# Patient Record
Sex: Male | Born: 1975 | Race: White | Hispanic: No | Marital: Married | Smoking: Former smoker
Health system: Southern US, Community
[De-identification: ages and names within clinical notes are randomized; demographics above are authoritative.]

## PROBLEM LIST (undated history)

## (undated) HISTORY — PX: ADENOIDECTOMY: SUR15

## (undated) HISTORY — PX: TONSILLECTOMY: SUR1361

---

## 1999-11-17 ENCOUNTER — Emergency Department (HOSPITAL_COMMUNITY): Admission: EM | Admit: 1999-11-17 | Discharge: 1999-11-17 | Payer: Self-pay | Admitting: Emergency Medicine

## 1999-11-17 ENCOUNTER — Encounter: Payer: Self-pay | Admitting: Emergency Medicine

## 1999-11-20 ENCOUNTER — Emergency Department (HOSPITAL_COMMUNITY): Admission: EM | Admit: 1999-11-20 | Discharge: 1999-11-20 | Payer: Self-pay | Admitting: Emergency Medicine

## 2000-11-18 ENCOUNTER — Emergency Department (HOSPITAL_COMMUNITY): Admission: EM | Admit: 2000-11-18 | Discharge: 2000-11-18 | Payer: Self-pay | Admitting: Emergency Medicine

## 2000-11-18 ENCOUNTER — Encounter: Payer: Self-pay | Admitting: Emergency Medicine

## 2001-01-17 ENCOUNTER — Emergency Department (HOSPITAL_COMMUNITY): Admission: EM | Admit: 2001-01-17 | Discharge: 2001-01-17 | Payer: Self-pay | Admitting: Emergency Medicine

## 2001-09-02 ENCOUNTER — Emergency Department (HOSPITAL_COMMUNITY): Admission: EM | Admit: 2001-09-02 | Discharge: 2001-09-02 | Payer: Self-pay | Admitting: Emergency Medicine

## 2002-11-05 ENCOUNTER — Encounter: Payer: Self-pay | Admitting: Emergency Medicine

## 2002-11-05 ENCOUNTER — Inpatient Hospital Stay (HOSPITAL_COMMUNITY): Admission: EM | Admit: 2002-11-05 | Discharge: 2002-11-07 | Payer: Self-pay | Admitting: Emergency Medicine

## 2003-08-14 ENCOUNTER — Emergency Department (HOSPITAL_COMMUNITY): Admission: EM | Admit: 2003-08-14 | Discharge: 2003-08-15 | Payer: Self-pay | Admitting: *Deleted

## 2004-01-06 ENCOUNTER — Observation Stay (HOSPITAL_COMMUNITY): Admission: EM | Admit: 2004-01-06 | Discharge: 2004-01-07 | Payer: Self-pay | Admitting: Emergency Medicine

## 2005-11-29 ENCOUNTER — Inpatient Hospital Stay (HOSPITAL_COMMUNITY): Admission: EM | Admit: 2005-11-29 | Discharge: 2005-11-30 | Payer: Self-pay | Admitting: Emergency Medicine

## 2005-11-30 ENCOUNTER — Ambulatory Visit: Payer: Self-pay | Admitting: Gastroenterology

## 2006-10-14 ENCOUNTER — Inpatient Hospital Stay (HOSPITAL_COMMUNITY): Admission: AD | Admit: 2006-10-14 | Discharge: 2006-10-15 | Payer: Self-pay | Admitting: Internal Medicine

## 2006-10-14 ENCOUNTER — Ambulatory Visit: Payer: Self-pay | Admitting: Gastroenterology

## 2007-09-23 ENCOUNTER — Observation Stay (HOSPITAL_COMMUNITY): Admission: EM | Admit: 2007-09-23 | Discharge: 2007-09-24 | Payer: Self-pay | Admitting: Emergency Medicine

## 2007-09-23 ENCOUNTER — Ambulatory Visit: Payer: Self-pay | Admitting: Internal Medicine

## 2007-11-04 ENCOUNTER — Inpatient Hospital Stay (HOSPITAL_COMMUNITY): Admission: EM | Admit: 2007-11-04 | Discharge: 2007-11-06 | Payer: Self-pay | Admitting: Emergency Medicine

## 2007-11-05 ENCOUNTER — Ambulatory Visit: Payer: Self-pay | Admitting: Gastroenterology

## 2010-09-05 NOTE — Consult Note (Signed)
NAME:  Samuel [name], Samuel [name]                ACCOUNT NO.:  [id number]   MEDICAL RECORD NO.:  [id number]          PATIENT TYPE:  INP   LOCATION:  A220                          FACILITY:  [facility]   PHYSICIAN:  Kassie Mends, M.D.      DATE OF BIRTH:  01/05/1976   DATE OF CONSULTATION:  10/14/2006  DATE OF DISCHARGE:  10/15/2006                                 CONSULTATION   REASON FOR CONSULTATION:  Abdominal pain and vomiting.   HPI:  Samuel [name] is a 35 year old male who has had intermittent  abdominal pain and vomiting for 7 years.  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  He reports being  worked up extensively at multiple institutions to include [facility], [facility], [facility] and [facility].  His workup reportedly includes multiple endoscopies, CT  scans and barium studies.  The records are not available to me. He  states he has been dealing with this problem since the age of 24.  His  workup has revealed no significant intraabdominal pathology.  He is  maintained chronically on Zantac as well as Levsin.  His last visit to a  gastroenterologist was 1-2 years ago.  He last saw Dr. Jena Gauss, [facility].  He reports using Levsin twice daily and Zantac 150 mg  twice daily.  He reports his symptoms are usually exacerbated by stress.  His symptoms are especially made worse when he has a large contract to  negotiate.  He is hospitalized between 2-10 times a year.  He usually  remains in the hospital for 48 hours and his symptoms resolve.  He  usually receives Zantac, Zofran and diazepam as well as Levsin.  His  symptoms, he reports having abdominal pain daily located in his  periumbilical area.  It is crampy.  It is always in the same spot.  His  daily symptoms come and go.  They may last from minutes to hours.  He  initially reported no particular triggers but then feels that his  symptoms may be made  worse with fatty foods or lactose-containing  products.  The fatty foods include, pizzas and hamburgers.  His diet  usually consists of grilled chicken and rice.  He also can tolerate cold  cuts.  He denies any alcohol use.  He still smokes marijuana.  Stress  makes his stomach hurt more.  He Lives his life scared of the stomach.  He denies any fever.  He does have heartburn and indigestion.  His  typical symptoms start with an increased vomiting.  Today he feels  better but his stomach Is not ready.  He has never been treated for  anxiety.  Typically, he has a bowel movement every day.  His bowel  movements are usually in the morning.  He may have a bowel movement that  occurs up to 5 times over an hour and a half period of time.  He denies  any difficulty swallowing.  He denies any blood in his vomit.  He has no  blood in his stool.   PAST MEDICAL HISTORY:  1. Chronic abdominal pain.  2. Gastroesophageal reflux disease.   PAST SURGICAL HISTORY:  Adenoidectomy.   MEDICATIONS:  1. Zantac.  2. Levsin twice a day.   MEDICATIONS IN THE HOSPITAL:  1. Levsin b.i.d.  2. Protonix 40 mg twice daily.  3. Morphine 1 mg every 4 hours as needed.  4. Zofran 4 mg IV every 6 hours as needed.   ALLERGIES:  PHENERGAN.   SOCIAL HISTORY:  1. He is married and has 3 children.  2. 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  3. He does not consume alcohol.  4. He continues to smoke marijuana.   REVIEW OF SYSTEMS:  As per the HPI, otherwise all systems are negative.   PHYSICAL EXAM:  Temperature 98.8, blood pressure 108/57, pulse 83,  respiratory rate 18, O2 sat 98%, weight 151.1 pounds (no change since  August of 2007).  GENERAL:  He is in no apparent distress, alert and oriented x4.  He  makes good eye contact.HEENT EXAM:  Atraumatic, normocephalic.  Pupils  equal and react to light.  Mouth no oral lesions.  Posterior pharynx  without erythema or exudate.NECK:  Has full range  of motion, no  lymphadenopathy.LUNGS:  Clear to auscultation bilaterally.  CARDIOVASCULAR EXAM:  A regular rhythm, no murmur, normal S1 and  S2.ABDOMEN:  Bowel sounds are present, soft.  Mild tenderness to  palpation in the epigastrium without rebound or guarding.  No abdominal  bruits or pulsatile masses.EXTREMITIES:  Without cyanosis, clubbing or  edema.NEURO:  He has no focal neurologic deficits.   LABS:  White count 12.2, hemoglobin 13.4 (unchanged since August of  2007), platelets 207,000, total bili 0.6, AST 50, ALT 28, albumin 3.4,  calcium 8.2, amylase 56, lipase 19.   Abdominal ultrasound from June 23rd:  Gallbladder normal, common bile  duct normal.  Liver, pancreas and spleen normal.  Aorta and IVC normal.  Prominent bowel noted in the subhepatic region.   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  He  admits that his episodes of abdominal pain and vomiting are precipitated  by stress.  He has had an extensive workup at multiple institutions  which revealed no significant intraabdominal pathology.  He currently  has normal liver enzymes, pancreatic enzymes as well as an abdominal  ultrasound.  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   Thank you for allowing me to see Samuel [name] in consultation.  My  recommendations follow.   RECOMMENDATIONS:  1. He is to continue his proton pump inhibitor.  He pays cash for      medicines.  I would discontinue the use of Zantac as an outpatient      and continue him on Prilosec as an outpatient.  2. Would add Valium as needed as an inpatient and continue BuSpar for      anxiety as an outpatient and titrate as needed for his symptoms of      anxiety.  3. Increase his Levsin to four times a day.  4. He may follow up  with Dr. Cira Servant in 6-8 weeks.  5. Continue clear liquids for now and advance to a full liquid diet in      the morning.  If he tolerates, would discharge  on October 15, 2006.      Kassie Mends, M.D.  Electronically Signed     SM/MEDQ  D:  10/14/2006  T:  10/15/2006  Job:  [id number]

## 2010-09-05 NOTE — H&P (Signed)
NAME:  Samuel [name], Samuel [name]                ACCOUNT NO.:  [id number]   MEDICAL RECORD NO.:  [id number]          PATIENT TYPE:  INP   LOCATION:  A325                          FACILITY:  [facility]   PHYSICIAN:  Mila Homer. Sudie Bailey, M.D.DATE OF BIRTH:  [birth year]   DATE OF ADMISSION:  11/04/2007  DATE OF DISCHARGE:  06/03/2009LH                              HISTORY & PHYSICAL   HISTORY:  This 35 year old presented to the [facility] ER today with abdominal  pain, nausea, vomiting, and some diarrhea.  He felt fine yesterday until  the evening when he began to feel nauseated.   Normally, if he uses Zofran for nausea and Levsin for his IBS, he states  he can stave off attacks.  He usually gets nauseated every 2 days and  requires hospitalization every 5-7 months.   These symptoms have gone for 7 years, since he was 31, that would be in  the year 2002.  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   He is born in [place].  Also of note, he has been married for 11 years and  has twin 35 year olds at home from his wife's first marriage, but they  had a baby between the 2 of them which arrived 7 years ago, about the  time his symptoms started.  He lives in the same house.   He has found if he stays to a rice and grilled chicken diet, he does  much better.  He absolutely cannot tolerate any greasy food including  hamburgers.  He can eat potatoes without a problem, he can also eat  bread without a problem.  Again, he had absolutely no symptoms till he  was age 71.   He does use marijuana, notes when he does this, he gets an appetite, and  he has less nausea, but he still cannot tolerate the greasy food, even  using marijuana.   ADMISSION EXAM:  GENERAL:  Showed a pleasant young man.  At the time I  examinedhim, was up on a bed in the floor.   He had alreadyreceived 2  liters of IV fluids.  VITAL SIGNS:  His temperature when he came to the hospital was 98.9,  blood pressure 123/70, pulse 68, respiratory rate 22.  O2 sat was 100%.  As he kept on staying in the ER, blood pressure dropped to 107/63  following 97/53, but the pulse is only 88 with this.  At the time I  examined him, he was oriented and alert.  There is no acute distress.  SKIN:  Skin turgor is normal.  MOUTH:  Mucous membranes appeared to be moist.  HEART:  Regular rhythm rate of 70.  LUNGS:  Clear throughout.  ABDOMEN:  Soft with some tenderness on palpation throughout.  EXTREMITIES:  There is no edema in the ankles.    His admission white cell count is 13,600 with 88% neutrophils, 8%  lymphocytes, H&H was 14.8 and 43.7, MCV  of 94, and platelet count of  204,000.  His CMP was normal except for glucose 135.  LFTs were normal.   His urine drug screen was positive for THC and opiates.   Urine showed an SG of 1.020, pH greater than 9.0, urine ketones greater  than 80.  There were 3-6 wbc's, 3-6 rbc's per HPF, rare bacteria.   ADMISSION DIAGNOSES:  1. Biliary tract disease.  2. Anxiety.  3. Abnormal urine.   PLAN:  We are checking a urine for C&S.  We will start on Levaquin 500  mg IV q.24 h.  Reviewed all his tests including abdominal CT,  ultrasounds, etc., and would repeat an ultrasound and also get  hepatobiliary study (per his history, he has had hepatobiliary studies  at both [facility] in [place]).  We will continue on Zofran  and low-dose Dilaudid.  Were it not for his elevated white cell count, I  would also consider Munchausen syndrome.  An H. pylori antibody is also  pending and stool for O&P and cultures.      Mila Homer. Sudie Bailey, M.D.  Electronically Signed     SDK/MEDQ  D:  11/04/2007  T:  11/05/2007  Job:  [id number]

## 2010-09-05 NOTE — H&P (Signed)
NAME:  Samuel [name], Samuel [name]                ACCOUNT NO.:  [id number]   MEDICAL RECORD NO.:  [id number]          PATIENT TYPE:  OBV   LOCATION:  A319                          FACILITY:  [facility]   PHYSICIAN:  Edward L. Juanetta Gosling, M.D.DATE OF BIRTH:  1975-12-11   DATE OF ADMISSION:  09/22/2007  DATE OF DISCHARGE:  LH                              HISTORY & PHYSICAL   Mr. Santibanez is a 35 year old who has a history of chronic recurrent  abdominal pain associated with nausea and vomiting.  Over the last  several days, he has had multiple episodes of nausea and vomiting.  He  came to the emergency room and was started on IV fluids, given  antiemetics, etc.  He says that he has had multiple bouts of a similar  problem in the past.  He said that he had a temperature as high as 102.  He has had multiple workups in the past, but has not had a definitive  diagnosis made.  He says that when he takes Levsin it helps.  He  sometimes takes Valium and that helps, but sometimes it just gets so bad  that he has to seek attention in the emergency room.  He was treated in  the emergency room, but was not able to be cleared of his symptoms, so  he was brought in for observation.   PAST MEDICAL HISTORY:  Other than these episodes is essentially  negative.  He says that he has had gastroesophageal reflux disease and  then this chronic abdominal pain.  He has being treated as if it is  irritable bowel, but I am not sure if he has actually been diagnosed as  that.   FAMILY HISTORY:  He does have a family history of irritable bowel  syndrome in his mother.  His father had a MI.  There is a history of  gastric cancer.   SOCIAL HISTORY:  He is married and lives at home.  He smokes about a  pack of cigarettes daily.  He does not use any alcohol.  There is a  history in the past medical history that he smokes marijuana, I did not  ask him that.   REVIEW OF SYSTEMS:  Otherwise essentially negative.   PHYSICAL EXAMINATION:   VITAL SIGNS:  Shows his temperature is 98.1,  respirations 20, blood pressure 97/63, O2 sats 100% on room air.  His  weight is 74.1 kg, height 73 inches.  HEENT:  His pupils are equal, round and reactive to light and  accommodation.  His nose and throat are clear.  His mucous membranes are  slightly dry.  NECK:  Supple without masses.  He does not have any bruits.  CHEST:  Fairly clear.  HEART:  Regular.  ABDOMEN:  Soft.  EXTREMITIES:  Showed no edema.  ABDOMEN:  He does show some voluntary guarding of his abdomen and he had  some mild tenderness.  Bowel sounds are hyperactive.  No rebound  tenderness.  CNS:  Grossly intact.   LABORATORY WORK:  Last night, white count was 13,900, hemoglobin 14.6,  platelets 186.  BMET showed potassium of 3.3, otherwise normal.  Hepatic  function panel was normal.  Lipase was normal.  Urinalysis essentially  normal.  He did show some ketones, suggesting that he was dehydrated.  He had acute abdominal series that showed a nonspecific nonobstructive  bowel gas pattern.   ASSESSMENT:  He has recurrent abdominal discomfort, nausea and vomiting,  which I think is of unknown cause.  He is on treatment now, and I will  plan to have him evaluated by the GI team as well.      Edward L. Juanetta Gosling, M.D.  Electronically Signed     ELH/MEDQ  D:  09/23/2007  T:  09/23/2007  Job:  [id number]

## 2010-09-05 NOTE — H&P (Signed)
NAME:  Samuel [name], Samuel [name]                ACCOUNT NO.:  [id number]   MEDICAL RECORD NO.:  [id number]          PATIENT TYPE:  INP   LOCATION:  A220                          FACILITY:  [facility]   PHYSICIAN:  Tesfaye D. Felecia Shelling, MD   DATE OF BIRTH:  June 23, 1975   DATE OF ADMISSION:  10/14/2006  DATE OF DISCHARGE:  06/24/2008LH                              HISTORY & PHYSICAL   CHIEF COMPLAINT:  Abdominal pain, nausea and vomiting.   HISTORY OF PRESENT ILLNESS:  This is a 35 year old male patient with a  history of chronic recurrent abdominal pain who was admitted due to  above complaint.  Patient claims he had a relapse of his symptoms since  the last 3 days.  He came to [facility] emergency department where he was  evaluated.  Patient was later transferred here to [facility] because  there is no gastroenterologist to evaluate the patient in [facility].  Patient received the pain medicine and IV antiemetic  medications.  He is feeling better.  His symptoms have improved.   REVIEW OF SYSTEMS:  The patient has no fever, chills, cough, shortness  of breath, chest pain, dysuria, urgency or frequency of urination.   PAST MEDICAL HISTORY:  Recurrent abdominal pain and nausea and vomiting.   MEDICATIONS:  Levsin 0.125 mg p.o. q.i.d.   SOCIAL HISTORY:  1. Patient is married.  2. He is self-employed.  3. He has no history of alcohol.   PHYSICAL EXAMINATION:  Patient is alert, awake, and sick looking.  VITALS:  Blood pressure 111/68, pulse 67, respiratory rate 20,  temperature 98 degrees Fahrenheit.  HEENT:  Pupils are equal, reactive.  NECK:  Supple.  CHEST:  Clear lung fields, good air entry.  CARDIOVASCULAR SYSTEM:  First and second heart sounds heard, no murmur,  no gallop.  ABDOMEN:  Soft and lax.  Bowel sound is positive.  No masses, no  organomegaly.  EXTREMITIES:  No leg edema.   LABS:  CBC:  A WBC of 2.2, hemoglobin 13.4, hematocrit 38.4, platelets  205,000, lipase 19, amylase 56.   Comprehensive panel:  Sodium 138,  potassium 3.6, chloride 109, carbon dioxide 23, glucose 100, BUN 7,  creatinine 0.79, alkaline phosphatase 56, AST 28, ALT 17, albumin 3.4,  calcium 8.2.   ASSESSMENT:  This is a 35 year old male patient with a history of  recurrent nausea and vomiting.  Patient had extensive workup in the  past.  He has been followed by gastroenterologist.  The patient is being  treated as a case of possible irritable bowel syndrome.   PLAN:  1. Will do GI consult.  2. Will continue current pain medicine and proton pump inhibitor.  3. Will continue patient on IV fluid.      Tesfaye D. Felecia Shelling, MD  Electronically Signed     TDF/MEDQ  D:  10/15/2006  T:  10/15/2006  Job:  [id number]

## 2010-09-05 NOTE — Consult Note (Signed)
NAME:  Samuel [name], Samuel [name]                ACCOUNT NO.:  [id number]   MEDICAL RECORD NO.:  [id number]          PATIENT TYPE:  OBV   LOCATION:  A319                          FACILITY:  [facility]   PHYSICIAN:  R. Roetta Sessions, M.D. DATE OF BIRTH:  [birth year]   DATE OF CONSULTATION:  09/23/2007  DATE OF DISCHARGE:                                 CONSULTATION   REASON FOR CONSULTATION:  Recurrent nausea, vomiting, abdominal pain.   REQUESTING PHYSICIAN:  Edward L. Juanetta Gosling, M.D.   HISTORY OF PRESENT ILLNESS:  A 35 year old Caucasian gentleman with  history of recurrent abdominal pain associated with nausea and vomiting,  requiring multiple hospitalizations for the same who presents for acute  onset of symptoms again.  We last saw him during hospitalization in June  2008.  He has had these symptoms now for about eight years.  He states  he ends up being hospitalized at least five times a year.  He has not  had any significant problems for the last seven months, although he  notes that he has to be on a very strict diet or he will have abdominal  pain, diarrhea and vomiting.  He eats mostly rice and grilled chicken.  He does not eat any dairy products.  He notes if he eats any fatty  foods, he has exacerbation of his symptoms as well as with any stress.  He describes his abdominal pain as cramping.  It is in the periumbilical  region.  He does have a burning quality at times.  He woke up around 4  a.m. two days ago with these symptoms.  States he had a lot of diarrhea  this time as well.  He also reports a temperature of 102.  He states  usually he does have some sort of temperature.  He also breaks out in a  sweat.  He states he drenched all the linens in his house this time.  He  denies any significant weight loss.  He does have some intermittent  heartburn and indigestion for which she takes Zantac p.r.n. but is on  omeprazole daily as well.  He denies any dysphasia or odynophagia.  He  takes  Levsin daily which seems to help with his cramps and loose stools.  Generally has only one bowel movement in the morning but if he eats  anything that does not agree with him, he may have to sit on the toilet  for three or four hours at a time.  He denies any blood in the stool or  melena.  Denies any dysuria or hematuria.  He admits to smoking  marijuana but states he has really cut back and only smokes two joints a  week as opposed to four daily.  He denies any alcohol use or tobacco  use.   He reports an extensive evaluation in the past.  He says he has been  worked up at [facility] and [facility], [facility] and here at [facility].  He reports having multiple  endoscopies, CTs and barium studies.  I looked in our old  chart and  unfortunately, we do not have any of the official studies in our chart  as far as endoscopies or colonoscopies.  Here at [facility],  the last abdominal ultrasound was in June 2008 and revealed prominent  small-bowel loops which are nonspecific.  Common bile duct diameter was  4, otherwise the liver and the gallbladder were normal.  The last CT at  [facility] was in April 2005, it was negative.  He had acute  abdominal series on admission.  This time it was nonobstructive bowel  gas pattern.  Urinalysis revealed 40 ketones, lipase 16. LFTs normal.  His glucose was slightly elevated at 128, potassium 3.3, sodium 135,  creatinine 0.89.  White count 13,900, hemoglobin 14.6, platelets  186,000.   HOME MEDICATIONS:  He says he is taking  1. Levsin sublingual three times a day.  2. Zantac 150 mg at night.  3. Omeprazole 20 mg daily.   ALLERGIES:  He states that PHENERGAN burns his veins up and refuses to  take any form of it.   PAST MEDICAL HISTORY:  1. Chronic GERD.  2. Chronic cyclic vomiting and abdominal pain as above, not well-      defined.  3. Chronic intermittent diarrhea.   PAST SURGICAL HISTORY:   Adenoidectomy.   FAMILY HISTORY:  Mother had IBS and diverticulitis and had some sort of  surgery with possible small-bowel resection.  Father had an MI and  cerebral aneurysm and apparently had some sort of colon surgery with  colonoscopy but again, details unavailable but was having reversible  colostomy back in 2007, when I last saw the patient.  A great uncle had  brain cancer.   SOCIAL HISTORY:  He is married.  He has three children.  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  Denies alcohol use.  Continues  to smoke marijuana, no tobacco use.   REVIEW OF SYSTEMS:  See HPI for GI.  CARDIOPULMONARY:  No chest pain or  shortness of breath.  CONSTITUTIONAL:  No weight loss.  GENITOURINARY:  See HPI.   PHYSICAL EXAMINATION:  VITAL SIGNS:  Temperature 97.5, pulse 70,  respirations 20, blood pressure 108/59, weight 74.1 kg, height 73  inches.  GENERAL APPEARANCE:  A pleasant, well-developed, well-nourished  Caucasian male in no acute distress.  SKIN:  Warm and dry, no jaundice.  HEENT:  Sclerae are nonicteric.  Oropharyngeal mucosa moist and pink.  No lesions, erythema or exudate.  No lymphadenopathy or thyromegaly.  CHEST:  Lungs are clear to auscultation.  CARDIOVASCULAR:  Regular rate and rhythm, normal S1 and S2, no murmurs,  rubs, or gallops.  ABDOMEN:  Positive bowel sounds.  Abdomen soft.  He has mild tenderness  in the epigastrium to deep palpation, no rebound or guarding, no  organomegaly or masses.  No abdominal hernias.  LOWER EXTREMITIES:  No edema.   IMPRESSION:  Patient is a 35 year old gentleman with history of  recurrent periumbilical crampy abdominal pain associated with nausea and  vomiting and sometimes diarrhea, requiring multiple hospitalizations  over the past eight years.  He presents with recurrent symptoms.  He  describes having a temperature of 102.  This time, he seems to have more  diarrhea than usual.  Symptoms  began two days ago.  He  continues to  have vomiting at this point.  He reports an extensive work-up in the  past but unfortunately, our records at the office really do not have all  the details.  A lot of his work-up has been done at [facility] and at [facility].  We had seen the patient  multiple times in the office and requested labs to be done but he failed  to follow through.  At one point in time, he did have a significantly  elevated cortisol level and plans for a dexamethasone suppression test  were requested but  he never had this done.  We also entertained the  possibility of acute intermittent porphyruria.  This, again, has not  been checked.  Etiology of his symptoms are not clear.  The differential  would include possibility of cyclic vomiting syndrome, functional  abdominal pain/irritable bowel syndrome; however, given his temperature  and his leukocytosis, I would be concerned about other etiology  including even gastroenteritis.   RECOMMENDATIONS:  1. Continue supportive measures.  2. We will provide Zofran on schedule for the next 24 hours.  3. Add Levsin morning and bedtime.  4. Recommend no further marijuana use.  5. To discuss further with Dr. Jena Gauss.  Further recommendations to      follow.      Tana Coast, P.AJonathon Bellows, M.D.  Electronically Signed    LL/MEDQ  D:  09/23/2007  T:  09/23/2007  Job:  [id number]   cc:   Tesfaye D. Felecia Shelling, MD  Fax: [phone]   Oneal Deputy. Juanetta Gosling, M.D.  Fax: [phone]

## 2010-09-05 NOTE — Group Therapy Note (Signed)
NAME:  Samuel, [name]                ACCOUNT NO.:  [id number]   MEDICAL RECORD NO.:  [id number]          PATIENT TYPE:  OBV   LOCATION:  A319                          FACILITY:  [facility]   PHYSICIAN:  Edward L. Juanetta Gosling, M.D.DATE OF BIRTH:  1976-02-18   DATE OF PROCEDURE:  DATE OF DISCHARGE:  09/24/2007                                 PROGRESS NOTE   SUBJECTIVE:  Samuel [name] says, he is still having a terrible time having  lots of nausea.  He has been diagnosed as, the hyperemesis syndrome by  the GI consultant, and their help is greatly appreciated.  This morning  he says, he is still not doing well and has abdominal discomfort,  nausea, and vomiting.   OBJECTIVE:  Physical exam shows that he is awake.  His temperature is  97.8, pulse 74, respirations 20, and blood pressure 109/51.  His chest  is fairly clear.  He begins some voluntary guarding before I even touch  his abdomen, but he does not have any rebound tenderness.  Bowel sounds  are present and active.   ASSESSMENT:  He seems a little better.   PLAN:  Double up on his PPI because he still says he has burning in his  abdomen.  Continue with all of his other treatments.  Dr. Felecia Shelling will  assume his care in the morning.      Edward L. Juanetta Gosling, M.D.  Electronically Signed     ELH/MEDQ  D:  09/24/2007  T:  09/24/2007  Job:  [id number]

## 2010-09-05 NOTE — Consult Note (Signed)
NAME:  Samuel [name], Samuel [name]                ACCOUNT NO.:  [id number]   MEDICAL RECORD NO.:  [id number]          PATIENT TYPE:  INP   LOCATION:  A325                          FACILITY:  [facility]   PHYSICIAN:  Kassie Mends, M.D.      DATE OF BIRTH:  08-Nov-1975   DATE OF CONSULTATION:  DATE OF DISCHARGE:                                 CONSULTATION   REQUESTING PHYSICIAN:  Tesfaye D. Felecia Shelling, MD   REASON FOR CONSULTATION:  Nausea.   HISTORY OF PRESENT ILLNESS:  The patient is a 35 year old Caucasian  gentleman whom we have seen previously on multiple hospitalizations for  similar symptoms of recurrent abdominal pain associated with nausea and  vomiting.  We last saw him in June of 2009.  He had signed out AMA  reportedly because his child was in a bicycle accident.  He states that  his current episode of severe abdominal pain, nausea and vomiting began  about 2 days ago.  He initially had multiple loose stools while on the  job in the early morning.  By the following day, his abdominal pain had  intensified and he started having nausea and vomiting.  Yesterday  morning, during the early hours, he was vomiting and having so much  diarrhea he could not hardly stand it.  He eventually came to the  emergency department.  He states that today he had already taken 14 hot  showers which alleviates his nausea.  He notes that when he does get  sick he takes multiple hot showers with good response.  He continues to  smoke marijuana regularly.  He states that he has cut back and using  1/4th bag weekly as opposed to a bag weekly.  During his last  hospitalization it was felt that his abdominal pain and vomiting was  secondary to ongoing marijuana use.  He has been smoking marijuana, now,  for 16 years.  It was felt that he had a nausea/vomiting syndrome  related to this.   He states that the only things that he can eat is rice and grilled  chicken.  He cannot eat any dairy products or fat as it exacerbates  the  symptoms.  He also has daily diarrhea and usually has bowel movements 1-  2 hours after morning.  He denies any blood in the stool or melena.  He  has not had as bad sweats this time as he usually does.  He has Levsin  twice daily, regularly.  He has been taking Zantac about five 150 mg  tablets daily.  He has Prilosec OTC and takes this as well.  He denies  any dysphagia or odynophagia.  He has lost 4 kg since his last admission  1 month ago.   On presentation his white count was slightly elevated at 13,600 it is  13,400 today.  His hemoglobin is 13.7.  LFTs are normal.  Sedimentation  rate normal at 5.  Urine drug screen positive for opiates, and  marijuana.  He has been afebrile.  He was not able to have his  gallbladder ultrasound this morning  as he cannot withhold using  narcotics for abdominal pain.   MEDICATIONS AT HOME:  1. Levsin sublingual twice a day as needed.  2. Zantac 150 mg tablets, he is taking up to 5 a day.  3. Omeprazole 20 mg daily p.r.n.   ALLERGIES:  Phenergan causes his veins to burn up.  He refuses to take  any form of it.   PAST MEDICAL HISTORY:  1. Chronic GERD.  2. Chronic cyclic vomiting and abdominal pain as above.  3. Chronic intermittent diarrhea.   PAST SURGICAL HISTORY:  Adenoidectomy.   FAMILY HISTORY:  Mother has IBS, diverticulitis and some sort of surgery  for a small-bowel resection.  Father had an MI, cerebral aneurysm and  had some sort of colon surgery, patient's wife believes that it was due  to colon cancer back in 2007.  A great uncle had brain cancer.   SOCIAL HISTORY:  He is married.  He has 3 children.  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  He denies alcohol use.  He continues to smoke marijuana regularly.  No  tobacco use.   REVIEW OF SYSTEMS:  See HPI for GI and constitutional.  CARDIOPULMONARY:  He has been coughing up some phlegm, and he does this with these   episodes.  Denies any shortness of breath or chest pain.  GENITOURINARY:  Denies any dysuria or hematuria.   PHYSICAL EXAMINATION:  VITAL SIGNS:  Weight 70.9 kg. Temperature 97.1,  pulse 64, respirations 20, blood pressure 106/54.  GENERAL:  A pleasant, thin, Caucasian male who appears to be  uncomfortable.  SKIN:  Warm and dry.  No jaundice.  HEENT:  Sclerae anicteric.  Oropharyngeal mucosa moist and pink.  CHEST:  Lungs are clear to auscultation.  CARDIAC EXAM:  Reveals regular rate and rhythm.  Normal S1, S2.  No  murmurs, rubs, or gallops.  ABDOMEN:  Positive bowel sounds. Abdomen soft.  He has moderate  tenderness in the epigastrium to deep palpation. No rebound or guarding.  No organomegaly or masses.  No abdominal  bruits or hernias.  EXTREMITIES:  No edema.   LABS:  As mentioned above.  In addition his potassium is 3.3, BUN 10,  creatinine 0.87, glucose 94, sodium 141.  Platelets 187,000.  Lipase 17,  total bilirubin 1, alkaline phosphatase 48, AST 23, ALT 15, albumin 4.2.   IMPRESSION:  The patient is a 35 year old gentleman with recurrent  abdominal pain, nausea and vomiting in the setting of chronic, regular  marijuana use.  It is notable that he takes multiple hot showers which  alleviates his nausea and vomiting.  He has taken 14 already today.  In  the literature there are multiple documented cases of intractable nausea  and vomiting related to marijuana use.  I suspect that his symptoms are  due to ongoing chronic marijuana use with use for now over 16 years.  He  also has regular postprandial loose stools possibly due to irritable  bowel syndrome.  The patient has had EGD and colonoscopies in the past,  unfortunately we do not have these records.   PLAN:  1. He needs to avoid all marijuana use.  2. Will followup when abdominal ultrasound is available.  3. Will provide Zofran 4 mg IV q.6 h. Scheduled dosing and then 4 mg      IV q.4 h. p.r.n.  4. Continue Levsin.   5. Will change Protonix to IV q.24 h.  6. Celiac disease antibody evaluation.  7. A 24-hour urinary collection for ALA and PBG to rule out acute      intermittent porphyria.  8. He may need to have repeat testing including an EGD but we discuss      further with Dr. Cira Servant pending current workup.      Tana Coast, P.A.      Kassie Mends, M.D.  Electronically Signed    LL/MEDQ  D:  11/05/2007  T:  11/05/2007  Job:  [id number]   cc:   Kassie Mends, M.D.  [address]  [place] , [place] [address]   Tesfaye D. Felecia Shelling, MD  Fax: [phone]

## 2010-09-08 NOTE — Discharge Summary (Signed)
NAME:  Samuel [name], Samuel [name]                          ACCOUNT NO.:  [id number]   MEDICAL RECORD NO.:  [id number]                   PATIENT TYPE:  INP   LOCATION:  A332                                 FACILITY:  [facility]   PHYSICIAN:  Sarita Bottom, M.D.                  DATE OF BIRTH:  December 02, 1975   DATE OF ADMISSION:  11/05/2002  DATE OF DISCHARGE:  11/07/2002                                 DISCHARGE SUMMARY   DISCHARGE DIAGNOSES:  1. Abdominal pain possibly due to gastroenteritis.  2. Rectal bleed probably secondary to number one above.   DISCHARGE MEDICATIONS:  Levsin 0.125 mg q.4h. p.r.n.   RECOMMENDATIONS:  Patient to followup with primary M.D. and also to see Dr.  Jena Gauss as an outpatient for further workup for abdominal pain and GI bleed.   Samuel [name] is a 35 year old man with no significant medical history who was  admitted to [facility] on November 05, 2002 when he presented to the  emergency room with a complaint of diarrhea and vomiting after eating a  sandwich.  The patient's stool was noted to be guaiac positive in the  emergency room.   PHYSICAL EXAMINATION:  VITAL SIGNS:  Pertinent findings temperature of 97.8,  BP of 138/90 with a heart rate of 66.  ABDOMEN:  Showed diffuse tenderness.   LABORATORY DATA:  His admission laboratory tests showed a WBC of 10,000,  hemoglobin of 15.  He had a normal liver function test.   The patient was admitted with an impression of abdominal pain possible  secondary to gastroenteritis and too a questionable GI bleed.  He was made  NPO, treated with IV fluids.  He had serial hemoglobin and hematocrit done.  Patient was also seen by GI consult whose impression was also of  gastroenteritis.  The patient's hematocrit and hemoglobin did not have any  significant drop.  His clinical course was significant for improvement in  his abdominal pain with supportive care.  Eventually the pain subsided and  the patient was resumed on feeding which he  tolerated  very well.  He has been seen on round today.  He has no new complaint.  Vital signs are stable.  Physical exam is essentially unremarkable.  __________ hemoglobin is 14.5, hematocrit is 42.3.  The patient will be  discharged home today on the above medications.  Appointment will be made  for him to see Dr. Jena Gauss and also his primary M.D.                                                 Sarita Bottom, M.D.    DW/MEDQ  D:  11/07/2002  T:  11/08/2002  Job:  [id number]   cc:   R. Roetta Sessions, M.D.  [address]  [facility] [address]  Fax: [phone]

## 2010-09-08 NOTE — Discharge Summary (Signed)
NAME:  Samuel [name], SKOWRON                          ACCOUNT NO.:  [id number]   MEDICAL RECORD NO.:  [id number]                   PATIENT TYPE:  INP   LOCATION:  A321                                 FACILITY:  [facility]   PHYSICIAN:  Vania Rea, M.D.              DATE OF BIRTH:  1975-05-25   DATE OF ADMISSION:  01/06/2004  DATE OF DISCHARGE:  01/07/2004                                 DISCHARGE SUMMARY   PRIMARY CARE PHYSICIAN:  Unassigned.   DISCHARGE DIAGNOSES:  1.  Acute gastroenteritis of unclear etiology, resolved.  2.  History of recurrent gastroenteritis.   DISPOSITION:  Discharged to home.   DISCHARGE CONDITION:  Stable.   DISCHARGE MEDICATIONS:  Resume outpatient Zantac and Levsin.   HOSPITAL COURSE:  Please refer to the admission history and physical  dictated yesterday.  This is a 35 year old Caucasian man with a history of  recurrent episodes of nausea, vomiting and diarrhea, which has been  extensively investigated by the GI service with no clear etiology found.  The patient describes his symptoms as coming rapidly under control when he  is admitted to the hospital, given IV fluids, antiemetics, and pain  medications.  The patient was admitted yesterday and given intravenous  Phenergan, IV fluids, and low doses of Demerol.  The patient had a total of  50 mg of Demerol throughout his hospital stay.  This morning the patient  says he is feeling much better.  He is having no nausea and vomiting.  He is  able to tolerate breakfast without any difficulty and wishes to be  discharged.   PHYSICAL EXAMINATION:  GENERAL:  On examination he is alert, smiling, happy,  and very pleasant.  He expressed thanks for the care given during his  hospital stay.  VITAL SIGNS:  His temperature was 98.6, pulse 75, respirations 20, and blood  pressure 101/63.  CHEST:  Clear to auscultation.  HEART:  Regular without murmurs.  ABDOMEN:  Soft and nontender.  EXTREMITIES:  No lower extremity  edema.   LABORATORY DATA:  His white count is 13.1, hemoglobin 14.4, MCV 91 with 76%  neutrophils.  His sodium is 136, potassium 3.5, chloride 107, CO2 is 23,  glucose 127, BUN 9, creatinine 0.9.  His urine drug screen was positive only  for tetrahydrocannabinol.   ASSESSMENT:  Recurrent gastroenteritis of unclear etiology, resolved.   PLAN:  Discharge home.   FOLLOW UP:  With a primary care physician of his choice.  He usually sees  Dr. Neita Carp but as indicated, he is no longer seeing this doctor.  Follow up  with Dr. Karilyn Cota and Dr. Jena Gauss p.r.n.     ___________________________________________                                         Vania Rea, M.D.  LC/MEDQ  D:  01/07/2004  T:  01/07/2004  Job:  [id number]

## 2010-09-08 NOTE — Consult Note (Signed)
NAME:  Samuel [name], Samuel [name]                          ACCOUNT NO.:  [id number]   MEDICAL RECORD NO.:  [id number]                   PATIENT TYPE:  INP   LOCATION:  A332                                 FACILITY:  [facility]   PHYSICIAN:  R. Roetta Sessions, M.D.              DATE OF BIRTH:  Aug 30, 1975   DATE OF CONSULTATION:  DATE OF DISCHARGE:                                   CONSULTATION   GASTROINTESTINAL CONSULTATION:   REASON FOR CONSULTATION:  Diarrhea with heme positive stools.   HISTORY OF PRESENT ILLNESS:  Samuel [name] is a 35 year old white male who  presented yesterday to the emergency room with acute onset of nausea,  vomiting and diarrhea at approximately noon yesterday, approximately one  hour after ingesting a bologna sandwich which he picked up at a convenience  store.  He complained of some burning pain to his epigastrium.  He then  took a packet of BC Powder.  At that point in time, he had approximately  seven bouts of diarrhea with bright red blood per his rectum.  He reported  to the emergency room at that point in time.  He also became nauseated and  had some emesis which was green.  He denies any NSAID use.  He reports BC  Powder use once a week.  He denies any history of hemorrhoids.  Stool was  found to be heme positive in the ER.  Acute abdominal films were obtained  which were negative.  He continues to complain of some crampy, low,  bilateral abdominal pain which is relieved some with Demerol.  He has had no  diarrhea today.  He has had no emesis today.   PAST MEDICAL HISTORY:  Positive for adenoids removed.   MEDICATIONS PRIOR TO ADMISSION:  Occasional weekly BC Powder.   ALLERGIES:  No known drug allergies.   FAMILY HISTORY:  Mother is alive at 42 years old with IBS and  diverticulitis.  Father is alive at 97 with a cerebral aneurism.  He has one  sister and one step-brother who are both healthy.   SOCIAL HISTORY:  He is married and has one healthy child.  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  He denies any tobacco or  alcohol use.   REVIEW OF SYSTEMS:  CONSTITUTIONAL:  He reports stable weight.  He reports  appetite is okay prior to this episode.  He reports slight fever yesterday.  MUSCULOSKELETAL:  He denies any musculoskeletal pains.  CARDIOPULMONARY:  He  denies any chest pain.  He denies any shortness of breath or palpitations.  SKIN:  He denies any rashes or jaundice.   PHYSICAL EXAMINATION:  VITAL SIGNS:  Temp 97.5, pulse 100, respirations 20,  blood pressure 101/75.  GENERAL:  He is a thin, well-developed, well-nourished, white male in no  acute distress.  He is alert and oriented  x 3.  SKIN:  Pink, warm and dry without any lesions.  There is no diaphoresis.  There is no jaundice.  HEENT:  Sclera are clear without any icterus.  Conjunctivae pink.  Oropharynx pink and moist without any lesions.  CHEST:  Heart regular, rate and rhythm, 90s-100 without murmurs, clicks,  rubs or gallops.  LUNGS:  Clear to auscultation bilaterally.  ABDOMEN:  Flat.  Positive bowel sounds x 4.  Positive mild epigastric  tenderness.  Nondistended.  No organomegaly.   LABORATORY DATA:  Hemoglobin 14.6, hematocrit 42.3 and stable.  Sodium 137,  potassium 3.8, chloride 107, CO2 24, glucose 102, BUN 18, creatinine 0.6.  Calcium 9.8.  Total protein 7.6, albumin 4.4, total bilirubin 1.0, direct  bilirubin 0.1, indirect 0.9, alkaline phosphatase 54, SGOT 30, SGPT 43.  Amylase 70, lipase 27.   ASSESSMENT:  Samuel [name] is a 35 year old white male with acute onset nausea,  vomiting and heme positive diarrhea.  Hemoglobin and hematocrit are stable.  This is possibly secondary to food borne illness versus gastroenteritis  (less likely) or diverticular disease (least likely).  He is otherwise  healthy with no known GI signs or symptoms.   RECOMMENDATIONS:  1. We will obtain stool cultures for O&P, WBC and C. difficile.  2. We will continue  supportive measures to include IV fluids, clear liquid     diet and Protonix.  3. We will add Levsin 0.125 mg sublingual before meals and at bedtime.  4. We will consider colonoscopy +/- EGD if bleeding persists or if there is     a drop in hemoglobin and hematocrit.  5. Further recommendations to follow.   I would like to thank Dr. Suzanne Boron for this consult.      Samuel [name], N.P.                 Jonathon Bellows, M.D.    KC/MEDQ  D:  11/06/2002  T:  11/07/2002  Job:  [id number]   cc:   Marden Noble, M.D.

## 2010-09-08 NOTE — H&P (Signed)
NAME:  Samuel [name], Samuel [name]                          ACCOUNT NO.:  [id number]   MEDICAL RECORD NO.:  [id number]                   PATIENT TYPE:  EMS   LOCATION:  ED                                   FACILITY:  [facility]   PHYSICIAN:  Marden Noble, M.D.                DATE OF BIRTH:  1975/11/05   DATE OF ADMISSION:  11/05/2002  DATE OF DISCHARGE:                                HISTORY & PHYSICAL   The patient does not have a primary care physician.   CHIEF COMPLAINT:  Nausea and vomiting and bloody stools since noon on the  day of admission.   HISTORY OF PRESENT ILLNESS:  Samuel [name] is a 35 year old Caucasian male who  presents to the emergency room after experiencing some abdominal pain mainly  in the epigastric region that started at 9:30 on the morning of admission  with some associated green emesis and bloody stools.  The patient also had  fever and chills on the day of admission.  The wife states that the patient  was in his normal state of health on the day prior to admission.  The  patient suffers from similar complaints about every 3-4 months; however, not  with bloody stools.   MEDICATIONS:  None.   ALLERGIES:  No known drug allergies.   PAST SURGICAL HISTORY:  None.   SOCIAL HISTORY:  The patient does not smoke.  He does not drink.  He is  married.  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   FAMILY HISTORY:  Significant for a great-uncle with stomach cancer, a mother  with irritable bowel syndrome, a father who recently had a myocardial  infarction who is in his 38s.   REVIEW OF SYSTEMS:  The patient denies any headache, rhinorrhea, epistaxis,  odynophagia, dysphagia.  He states that he has lost some weight.  He could  not quantify how much.  He denies any shortness of breath, dyspnea on  exertion, chest pain, coughing, orthopnea, PND, lower extremity swelling,  dysuria, hematuria, bruises.  All other systems  were reviewed and were  negative.   PHYSICAL EXAMINATION:  VITAL SIGNS:  Temperature 97.8, blood pressure  138/90, pulse 66, respirations 20, saturating 99% on room air.  A 10/10 on  the pain scale.  GENERAL:  When I saw the patient, he was very diaphoretic, was not very  cooperative with the history taking process, had his face covered with the  blankets, very diaphoretic.  HEENT:  Atraumatic, normocephalic.  Pupils were reactive to light.  Extraocular muscles intact.  Sclerae anicteric.  Conjunctiva was moist.  Eyelids had no gross abnormalities.  Nares had no discharge or lesions.  Oropharynx was moist.  He had a tongue ring in, did not look infected.  No  other abnormalities of his mouth.  NECK:  Supple.  No lymphadenopathy or JVD.  No thyromegaly.  No carotid  bruits.  HEART:  Tachycardic.  LUNGS:  Clear to auscultation bilaterally.  No wheezes, rales, or rhonchi.  ABDOMEN:  Diffusely tender, especially in the epigastric region.  It is  nondistended.  Normal bowel sounds.  No masses felt.  EXTREMITIES:  No clubbing, cyanosis, or edema.  NEUROLOGIC:  The patient was not cooperative with the exam.   LABORATORY DATA:  Rectal was heme positive per emergency room physician with  bloody stools.  Acute abdominal series was read as negative.   White count was 10,000, hemoglobin 15, hematocrit 45, platelets 205.  PT,  INR, PTT within normal limits.  BMP within normal limits.  LFT's within  normal limits.   IMPRESSION:  1. Abdominal pain.  We will treat the patient symptomatically.     Gastroenterology consult, Dr. Jena Gauss has been notified.  Consider a CAT     scan in the a.m. if the patient continues to have abdominal pain.  2. Gastrointestinal bleed.  We will give him intravenous fluids, do serial     H&H, intravenous Protonix.  The patient will be n.p.o.  Consider     endoscopy procedure as per gastroenterology.  Currently, the patient is     not anemic; however, once he is hydrated  he may need blood.  We will     follow his H&H.                                               Marden Noble, M.D.    JD/MEDQ  D:  11/05/2002  T:  11/05/2002  Job:  [id number]

## 2010-09-08 NOTE — Consult Note (Signed)
NAME:  Samuel [name], Samuel [name]                ACCOUNT NO.:  [id number]   MEDICAL RECORD NO.:  [id number]          PATIENT TYPE:  INP   LOCATION:                                FACILITY:  [facility]   PHYSICIAN:  R. Roetta Sessions, M.D. DATE OF BIRTH:  April 25, 1975   DATE OF CONSULTATION:  11/30/2005  DATE OF DISCHARGE:                                   CONSULTATION   REASON FOR CONSULTATION:  Acute on chronic abdominal pain.   HISTORY OF PRESENT ILLNESS:  The patient is a 35 year old Caucasian  gentleman with approximately a 7 year history of intermittent severe  abdominal pain.  He used to have just 2-3 episodes a year but over the last  several years he is having up to 5-10 episodes a year.  Symptoms began with  feeling of illness.  He develops intractable nausea and vomiting,  abdominal pain and diarrhea.  He describes his abdominal pain as severe  cramping type pain.  It is no specific location.  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  He has been evaluated at multiple medical centers along the [facility] as well as at [facility] and [facility] and here locally.  He was last seen in our practice in 2004  or 2005.  He reports having upper endoscopies, colonoscopies, barium enemas,  CTs, abdominal ultrasounds, which have been unremarkable.  On a daily basis  he does have GI symptoms which cause him to avoid food.  He states he  prefers not to eat because he gets abdominal pain and just feels bad.  He  does have reflux and indigestion.  He is taking Rolaids and Zantac as  needed.  Generally he has 2-3 bowel movements every morning.  He states he  is in the bathroom for about an hour each morning because of his abdominal  cramping and diarrhea.  He has occasional bright red blood per rectum.  His  weight has been stable for the last couple of years.  However, he does admit  to about a 75 pound weight loss over the course of the last 7 years.  He  notes if he eats  any greasy foods at all his stomach pain worsens.   When these severe attacks occur he has at least 24 hours of vomiting and  ends up vomiting bile.  He gets sweats with it.  Often he does have to go to  the ER for supportive measures.  He admits to smoking marijuana up to 4  times a day to help with his appetite and to ease his abdominal pain.  He  has been smoking marijuana for 10 years now.  Lately he has backed down to  one a day to see if it made any difference in his symptoms.   MEDICATIONS:  Rolaids and Tums p.r.n.  Denies any NSAID or aspirin use.   ALLERGIES:  No known drug allergies.   PAST MEDICAL HISTORY:  Chronic abdominal pain as outlined above.  Chronic  GERD.  Adenoidectomy as a child.   FAMILY  HISTORY:  Mother has history of IBS and diverticulitis.  She possibly  has had some sort of small bowel resection recently.  The patient does not  know the details.  His father has had MI, cerebral aneurysm.  He is  experiencing problems with his colon and is going to be having reversible  colostomy placed in the near future but, again, the patient does not know  details.  He had a great uncle who had brain cancer.   SOCIAL HISTORY:  He is married with two children.  He is self-employed,  Paramedic for [employer].  He does not  consume any alcohol.  He smokes marijuana usually 4 joints a day for the  past 10 years.  No IV or intranasal drug use.   REVIEW OF SYSTEMS:  GU:  Denies any weight loss or hematuria.  Denies any  dysuria.  CARDIOPULMONARY:  No chest pain, shortness of breath.   PHYSICAL EXAMINATION:  VITAL SIGNS:  Temperature 97.2, pulse 60,  respirations 20, blood pressure 105/51, weight 150.9, height 73 inches.  GENERAL:  A pleasant, well-nourished, well-developed Caucasian male in no  acute distress.  SKIN:  Warm and dry.  No jaundice.  HEENT:  Sclerae nonicteric.  Oropharyngeal mucosa moist and pink.  No  lesions, erythema or  exudate.  No lymphadenopathy or thyromegaly.  CHEST:  Lungs are clear to auscultation.  CARDIAC:  Reveals regular rate and rhythm.  Normal S1, S2.  No murmurs, rubs  or gallops.  ABDOMEN:  Positive bowel sounds.  Soft, nondistended.  Mild diffuse  tenderness to deep palpation.  No organomegaly or masses.  No rebound  tenderness or guarding.  No abdominal bruits or hernias.  EXTREMITIES:  No edema.   LABORATORY DATA:  White count 15,800, down to 10,000.  Hemoglobin 13,  hematocrit 37.5, platelet 197,000.  Sodium 139, potassium 4, BUN 11,  creatinine 0.9, glucose 87, total bilirubin 0.9, alkaline phosphatase 48,  AST 26, ALT 22, albumin 4.6, lipase 22.  Acute abdominal series was  negative.   IMPRESSION:  The patient is a 35 year old Caucasian male with acute on  chronic abdominal pain, nausea and vomiting and diarrhea.  He has severe  episodes which are cyclic occurring 5-10 times a year and prompts ED visits  and hospitalizations.  He reports multiple studies as outlined above,  although we do not have these records except for an abdominal ultrasound  done 3 years ago.  He also has milder daily GI symptoms stating abdominal  pain postprandially, diarrhea and reflux.  He smokes daily marijuana to  ease his symptoms.  He has been maintaining his weight.  Given reported  extensive evaluation with negative workup we have to question possibility of  rare etiologies such as acute intermittent porphyria.   RECOMMENDATIONS:  To discuss further with Dr. Jena Gauss.  May need to consider  ruling out acute intermittent porphyria although it can be difficult to  detect with spot urines.  This can be aggravated both with marijuana use as  well as fasting.  Further recommendations to follow.      Tana Coast, P.AJonathon Bellows, M.D.  Electronically Signed    LL/MEDQ  D:  11/30/2005  T:  11/30/2005  Job:  [id number]

## 2010-09-08 NOTE — H&P (Signed)
NAME:  Samuel [name], Samuel [name]                          ACCOUNT NO.:  [id number]   MEDICAL RECORD NO.:  [id number]                   PATIENT TYPE:  INP   LOCATION:  A321                                 FACILITY:  [facility]   PHYSICIAN:  Vania Rea, M.D.              DATE OF BIRTH:  05/15/1975   DATE OF ADMISSION:  01/06/2004  DATE OF DISCHARGE:                                HISTORY & PHYSICAL   PRIMARY CARE PHYSICIAN:  Unassigned.   CHIEF COMPLAINT:  Nausea, vomiting, and diarrhea since last night.   HISTORY OF PRESENT ILLNESS:  This is a 35 year old Caucasian man with a past  history of recurrent episodes of nausea, vomiting and diarrhea of unclear  etiology which usually settles with symptomatic treatment.  Patient says  episodes usually start with a high fever and then has repeated bouts of  vomiting and diarrhea, says since last night he has vomited about 50-100  times mostly dry heaves and has had about 20 diarrheal stools.  Patient did  not settle in the emergency room with symptomatic treatment and is admitted  for ongoing management.  Patient denies indigestion of any unusual food,  denies these symptoms in any of his family contacts, denies contacts with  any sick individuals.   PAST MEDICAL HISTORY:  Recurrent nausea and vomiting.   MEDICATIONS:  None.   ALLERGIES:  No known drug allergies.   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   FAMILY HISTORY:  Mother has irritable bowel syndrome and diverticulitis,  father cerebral aneurysm, other siblings are healthy.   REVIEW OF SYSTEMS:  A full 10-point review of systems revealed only fevers  which he says he recorded at 103 today and abdominal pain because of severe  retching.  He denies any jaundice, intermittent dyspepsia or significant  weight changes.  His stool he describes as brown without  blood.  His vomitus  he says is occasionally red.   PHYSICAL EXAMINATION:  GENERAL:  Healthy-looking young Caucasian man lying  in bed, does not appear to be in any distress but keeps his eyes closed and  gives the appearance of being asleep however, on hearing the remark that  pain medications will be withheld if he is drowsy patient suddenly became  wide awake.  VITALS:  Temperature 97.3, pulse 76, blood pressure 140/86, respirations 20.  He describes his abdominal pain as 10/10.  HEENT:  His pupils are round, equal, and reactive to light.  His mucous  membranes are pink and very moist.  There is no evidence of dehydration.  There is no lymphadenopathy, no thyromegaly.  CHEST:  His chest is clear to auscultation bilaterally.  ABDOMEN:  His abdomen is diffusely tender when examined with my hands but  when auscultating with the stethoscope there is only epigastric tenderness  but  no tenderness in other areas.  He has normal abdominal bowel sounds.  EXTREMITIES:  His extremities are without edema, he has 3+ pulses  bilaterally, and the feet are warm.  CNS:  Initially drowsy then became alert and oriented and fully cooperative.  There is no neurologic deficit.   LABORATORIES:  His white count is 12.1, hemoglobin 15.4, MCV 90.7, RDW 13.6,  platelets 231, his neutrophils 66%, absolute granulocyte count 7.9.  Sodium  is 135, potassium 3.7, chloride 107, CO2 24, glucose 115, BUN 17, creatinine  1, calcium 9.1, total protein 7.6, albumin 4.5, AST 27, ALT 23, total  bilirubin 0.5, alk phos 59, his lipase is 28.  His abdominal x-ray shows no  acute abnormalities but positive bowel gas.   ASSESSMENT:  Recurrent episodes of gastroenteritis of unclear etiology, no  metabolic disturbance at this time.   PLAN:  We will admit this patient, start him on a clear liquid diet with  intravenous Phenergan for nausea and vigorous IV fluid supplementation  although he does not appear to be dehydrated.  We  will review him in the  morning.  We will also add Protonix twice daily and Levsin.  We will also do  H. pylori serology.  We will insist on collecting vomitus and stool for  assessment.     ___________________________________________                                         Vania Rea, M.D.   LC/MEDQ  D:  01/06/2004  T:  01/06/2004  Job:  [id number]

## 2010-09-08 NOTE — Discharge Summary (Signed)
NAME:  ORVILLE, MENA                ACCOUNT NO.:  [id number]   MEDICAL RECORD NO.:  [id number]          PATIENT TYPE:  INP   LOCATION:  A336                          FACILITY:  [facility]   PHYSICIAN:  Tesfaye D. Felecia Shelling, MD   DATE OF BIRTH:  03/29/1976   DATE OF ADMISSION:  11/29/2005  DATE OF DISCHARGE:  08/10/2007LH                                 DISCHARGE SUMMARY   DISCHARGE DIAGNOSES:  1. Chronic abdominal pain, etiology unclear.  2. Hypokalemia.   HOSPITAL COURSE:  This is a 35 year old male patient with history of chronic  abdominal pain which was previously extensively evaluated both in this  hospital and in [facility] as well as [facility] [place], who came with  abdominal pain.  The patient was admitted on IV pain medications.  GI  consult was done.  The patient was evaluated by GI and there was no new  recommendation.  The patient has been improved.  He was discharged home with  instructions to continue his home medications and to follow up with GI.      Tesfaye D. Felecia Shelling, MD  Electronically Signed     TDF/MEDQ  D:  01/01/2006  T:  01/01/2006  Job:  [id number]

## 2010-09-08 NOTE — Discharge Summary (Signed)
NAME:  Samuel [name], Samuel [name]                ACCOUNT NO.:  [id number]   MEDICAL RECORD NO.:  [id number]          PATIENT TYPE:  INP   LOCATION:  A325                          FACILITY:  [facility]   PHYSICIAN:  Tesfaye D. Felecia Shelling, MD   DATE OF BIRTH:  04-08-1976   DATE OF ADMISSION:  11/04/2007  DATE OF DISCHARGE:  07/16/2009LH                               DISCHARGE SUMMARY   DISCHARGE DIAGNOSES:  1. Chronic nausea and vomiting.  2. History of substance abuse.  3. Chronic pain syndrome.   DISCHARGE MEDICATIONS:  The patient signed against medical advice, and  he has not received any prescription medication on discharge.   HOSPITAL COURSE:  This is a 35 years old patient who was in and out of  hospital due to nausea and vomiting, was brought to emergency room with  the above complaints.  The patient is known to have a history of  substance abuse, especially marijuana.  The patient was previously  advised to stop smoking marijuana, which is probably inducing symptoms.  He was admitted at this time with similar complaints.  After admission,  the patient was demanding for narcotic pain medications.  He also  refused to see Mental Health for evaluation of substance abuse.  The  patient later signed out against medical advice.  He will be followed  with his gastroenterologist.      Ninetta Lights D. Felecia Shelling, MD  Electronically Signed     TDF/MEDQ  D:  12/09/2007  T:  12/10/2007  Job:  [id number]

## 2010-09-08 NOTE — H&P (Signed)
NAME:  Samuel [name], Samuel [name]                ACCOUNT NO.:  [id number]   MEDICAL RECORD NO.:  [id number]          PATIENT TYPE:  INP   LOCATION:  A337                          FACILITY:  [facility]   PHYSICIAN:  Margaretmary Dys, M.D.DATE OF BIRTH:  11-Apr-1976   DATE OF ADMISSION:  11/29/2005  DATE OF DISCHARGE:  LH                                HISTORY & PHYSICAL   PRIMARY CARE PHYSICIAN:  Tesfaye D. Felecia Shelling, M.D.   ADMISSION DIAGNOSES:  1. Acute on chronic abdominal pain.  2. Nausea and vomiting.  3. Dehydration.  4. Hypokalemia.   CHIEF COMPLAINT:  Nausea and vomiting and abdominal pain of one day  duration.   HISTORY OF PRESENT ILLNESS:  Samuel [name] is a 35 year old Caucasian male who  presents to the emergency room with acute abdominal pain of about one day  duration.  The patient reports on and off chronic abdominal pain over the  past six years for which he has been extensively reviewed and evaluated in  [place], [facility], and [place] and even locally here by Dr. Karilyn Cota and  Dr. Jena Gauss.  The patient has not had a diagnosis and it is unclear why he  keeps having the recurrent abdominal pain.  The last episode he had was in  2005.  The patient reports that the abdominal pain is diffuse.  He gives a  very vague description.  The pain, he said, was about 9 out of 10 at its  worst and partly relieved with pain medication.  There are no known  aggravating or relieving factors.  He denies any diarrhea or constipation.  He has had some nausea and vomiting over the past day or so.  He denies any  fevers or chills.  No night sweats.  The patient said initially before his  abdominal pain started in 1997, he weighed over 200 pounds.  He has lost  over 100 pounds now but his weight has remained stable over the past couple  of years.   On presentation to the emergency room, he was mildly dehydrated and was very  uncomfortable.  [name] mostly for pain control.  This is  an observation admission.   REVIEW OF SYSTEMS:  A 10-point review of systems is otherwise negative  except as mentioned in history of present illness.   PAST MEDICAL HISTORY:  1. Chronic abdominal pain of unknown etiology.  2. Gastroesophageal reflux disease which seems to be improved with Zantac      or Rolaids which he is not currently on right now.   ALLERGIES:  No known drug allergies.   FAMILY HISTORY:  Great uncle with stomach cancer.  Mother with irritable  bowel syndrome.  Father had a myocardial infarction in his 12s.   SOCIAL HISTORY:  The patient is married.  He smokes a pack of cigarettes a  day.  He does not drink alcohol.  He also smokes marijuana. He has two  children.  He works in the [employer].   PHYSICAL EXAMINATION:  GENERAL:  Conscious, alert, comfortable, not in acute  distress, well  oriented to time, place and person.  VITAL SIGNS:  Blood pressure 95/52, pulse 73, respirations 20.  T-max 98.6.  Oxygen saturation 98% on room air.  HEENT:  Normocephalic, atraumatic.  Oral mucosa moist with no exudates.  NECK:  Supple with no JVD or lymphadenopathy.  LUNGS:  Clear clinically with good air entry bilaterally.  HEART:  S1 and S2 regular.  No S3, gallops or rubs.  ABDOMEN:  Obese, soft, nontender.  Bowel sounds were positive.  No masses  palpable.  EXTREMITIES:  No edema.  No induration or tenderness was noted.  CNS:  Grossly intact.   LABORATORY/DIAGNOSTIC DATA:  Acute abdominal series is done in the ED and  was negative without any evidence of free air.  White blood cell count was  15.6, hemoglobin 15.8, hematocrit 45, platelet count 244 with neutrophils of  10.8%.  Sodium 139, potassium 3.3, chloride 104, CO2 23, glucose 109, BUN  20, creatinine 1, total bilirubin 0.9, AST 23, ALT 22, albumin 7.8, calcium  9.7, lipase 22.   ASSESSMENT/PLAN:  Samuel [name] is a 35 year old Caucasian male who presented  to the emergency room with acute on chronic  abdominal pain.  Apparently the  abdominal pain has been going over the past six to seven years, diffuse in  nature, usually around the umbilical area.  The patient has had multiple  cardiac evaluations and also GI evaluations at different hospitals with no  positive results.  The patient is admitted mostly for pain control and  hydration and correction of electrolyte imbalance.  Will continue pain  medication and give him Phenergan or Zofran for analgesia.   By the time I examined the patient, the patient was comfortable and said his  pain was down to 2 out of 10.  I do anticipate the patient may be able to be  discharged over the next two days unless the gastroenterologist feels  otherwise.  I also obtained a urine drug screen.   Dr. Avon Gully is his regular doctor and will be taking over his care  tomorrow morning.      Margaretmary Dys, M.D.  Electronically Signed     AM/MEDQ  D:  11/29/2005  T:  11/30/2005  Job:  [id number]   cc:   Tesfaye D. Felecia Shelling, MD  Fax: [phone]

## 2010-09-08 NOTE — Discharge Summary (Signed)
NAME:  Samuel [name], Samuel [name]                ACCOUNT NO.:  [id number]   MEDICAL RECORD NO.:  [id number]          PATIENT TYPE:  OBV   LOCATION:  A319                          FACILITY:  [facility]   PHYSICIAN:  Edward L. Juanetta Gosling, M.D.DATE OF BIRTH:  1975/10/13   DATE OF ADMISSION:  09/22/2007  DATE OF DISCHARGE:  06/03/2009LH                               DISCHARGE SUMMARY   PRIMARY CARE PHYSICIAN:  Tesfaye D. Felecia Shelling, MD.   FINAL DISCHARGE DIAGNOSES:  1. Hyperemesis syndrome.  2. Nausea and vomiting secondary to number 1.  3. Gastroesophageal reflux disease.  4. Chronic abdominal pain.  5. Hypokalemia.   HISTORY:  Mr. Olmeda is a 35 year old who has chronic recurrent  abdominal pain associated with nausea and vomiting.  He says that this  occurs infrequently, but when it happens, his vomiting is not able to be  controlled and he gets a temperature.  He says he has had a temperature  as high as 102.  He says he takes Levsin sometimes.  He takes Valium  sometimes, but sometimes it gets so bad he has to come to the emergency  room.  He does not have medications at this point as best I can tell.   PHYSICAL EXAMINATION:  VITAL SIGNS:  His temperature is 98.1,  respirations 20, blood pressure 97/63, O2 sat was 100% on room air,  weight 74.1 kg, height 73 inches.  ABDOMEN:  Actually soft.  He did show some voluntary guarding and mild  tenderness.  Bowel sounds are hyperactive, but no rebound tenderness.   LABORATORY DATA:  His white blood count was 13,900, hemoglobin 14.6,  BMET showed potassium of 3.3 which was replaced.   HOSPITAL COURSE:  He was treated with IV fluids, given pain medication,  antiemetics and had GI consultation.  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  He was being treated with the  previously mentioned  medications when he signed out of the hospital  against medical advice.  He is encouraged to follow up with Dr. Felecia Shelling.      Edward L. Juanetta Gosling, M.D.  Electronically Signed    ELH/MEDQ  D:  09/25/2007  T:  09/25/2007  Job:  [id number]

## 2010-12-14 ENCOUNTER — Inpatient Hospital Stay: Payer: Self-pay | Admitting: Gastroenterology

## 2010-12-18 ENCOUNTER — Inpatient Hospital Stay: Payer: Self-pay | Admitting: Gastroenterology

## 2010-12-18 ENCOUNTER — Telehealth: Payer: Self-pay | Admitting: Gastroenterology

## 2010-12-18 NOTE — Telephone Encounter (Signed)
Noted  

## 2011-01-18 LAB — RAPID URINE DRUG SCREEN, HOSP PERFORMED
Amphetamines: NOT DETECTED
Benzodiazepines: NOT DETECTED
Tetrahydrocannabinol: POSITIVE — AB

## 2011-01-18 LAB — HEPATIC FUNCTION PANEL
ALT: 18
Alkaline Phosphatase: 53
Bilirubin, Direct: 0.1
Indirect Bilirubin: 0.9

## 2011-01-18 LAB — DIFFERENTIAL
Basophils Relative: 0
Eosinophils Absolute: 0
Eosinophils Absolute: 0
Eosinophils Relative: 0
Eosinophils Relative: 0
Lymphocytes Relative: 9 — ABNORMAL LOW
Lymphs Abs: 1.2
Monocytes Absolute: 0.6
Monocytes Absolute: 0.5
Monocytes Relative: 4
Neutrophils Relative %: 88 — ABNORMAL HIGH

## 2011-01-18 LAB — BASIC METABOLIC PANEL
Chloride: 109
GFR calc non Af Amer: >60
Glucose, Bld: 128 — ABNORMAL HIGH
Potassium: 3.3 — ABNORMAL LOW
Sodium: 135

## 2011-01-18 LAB — CBC
HCT: 41.3
Hemoglobin: 14.6
Hemoglobin: 14.8
MCV: 94.4
RDW: 13.2
RDW: 13.1
WBC: 13.9 — ABNORMAL HIGH

## 2011-01-18 LAB — URINE MICROSCOPIC-ADD ON

## 2011-01-18 LAB — URINALYSIS, ROUTINE W REFLEX MICROSCOPIC
Bilirubin Urine: NEGATIVE
Bilirubin Urine: NEGATIVE
Glucose, UA: NEGATIVE
Hgb urine dipstick: NEGATIVE
Hgb urine dipstick: NEGATIVE
Ketones, ur: 40 — AB
Ketones, ur: >80 — AB
Protein, ur: NEGATIVE
Protein, ur: 100 — AB
Urobilinogen, UA: 0.2

## 2011-01-18 LAB — COMPREHENSIVE METABOLIC PANEL
ALT: 15
Albumin: 4.2
Alkaline Phosphatase: 48
Glucose, Bld: 135 — ABNORMAL HIGH
Potassium: 3.6
Sodium: 140
Total Protein: 7.2

## 2011-01-18 LAB — URINE CULTURE: Special Requests: NEGATIVE

## 2011-01-19 LAB — H. PYLORI ANTIBODY, IGG: H Pylori IgG: <0.4

## 2011-01-19 LAB — BASIC METABOLIC PANEL
BUN: 10
CO2: 23
Chloride: 110
Creatinine, Ser: 0.87

## 2011-01-19 LAB — GLIADIN ANTIBODIES, SERUM
Gliadin IgA: 1.2 U/mL (ref <7)
Gliadin IgG: 1.2 U/mL (ref <7)

## 2011-01-19 LAB — CBC
Hemoglobin: 13.7
RDW: 13.4

## 2011-01-19 LAB — RETICULIN AB, IGA: Reticulin Antibody, IgA: NEGATIVE

## 2011-01-19 LAB — DIFFERENTIAL
Basophils Absolute: 0
Lymphocytes Relative: 22
Monocytes Absolute: 1.6 — ABNORMAL HIGH
Neutro Abs: 8.8 — ABNORMAL HIGH

## 2011-01-19 LAB — TISSUE TRANSGLUTAMINASE, IGA: Tissue Transglutaminase Ab, IgA: 0.5 U/mL (ref <7)

## 2011-02-07 LAB — DIFFERENTIAL
Band Neutrophils: 0
Eosinophils Relative: 0
Metamyelocytes Relative: 0
Monocytes Relative: 2 — ABNORMAL LOW
nRBC: 0

## 2011-02-07 LAB — CBC
MCHC: 34.9
MCV: 93.1
Platelets: 207
RDW: 13.5

## 2011-02-07 LAB — COMPREHENSIVE METABOLIC PANEL
AST: 28
Albumin: 3.4 — ABNORMAL LOW
Calcium: 8.2 — ABNORMAL LOW
Creatinine, Ser: 0.79
GFR calc Af Amer: >60

## 2015-08-16 ENCOUNTER — Ambulatory Visit: Payer: Self-pay | Admitting: Orthopaedic Surgery

## 2015-08-18 ENCOUNTER — Ambulatory Visit (INDEPENDENT_AMBULATORY_CARE_PROVIDER_SITE_OTHER): Payer: Self-pay

## 2015-08-18 ENCOUNTER — Ambulatory Visit (INDEPENDENT_AMBULATORY_CARE_PROVIDER_SITE_OTHER): Payer: Self-pay | Admitting: Orthopaedic Surgery

## 2015-08-18 VITALS — BP 115/66 | HR 53 | Temp 97.7°F | Ht 73.0 in | Wt 148.0 lb

## 2015-08-18 DIAGNOSIS — S6291XA Unspecified fracture of right wrist and hand, initial encounter for closed fracture: Secondary | ICD-10-CM

## 2015-08-18 DIAGNOSIS — S62616A Displaced fracture of proximal phalanx of right little finger, initial encounter for closed fracture: Secondary | ICD-10-CM
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
## 2016-04-04 ENCOUNTER — Ambulatory Visit (INDEPENDENT_AMBULATORY_CARE_PROVIDER_SITE_OTHER): Payer: Self-pay | Admitting: Orthopaedic Surgery

## 2016-04-04 ENCOUNTER — Ambulatory Visit (INDEPENDENT_AMBULATORY_CARE_PROVIDER_SITE_OTHER): Payer: Self-pay

## 2016-04-04 VITALS — BP 119/56 | HR 65 | Ht 73.0 in | Wt 190.0 lb

## 2016-04-04 DIAGNOSIS — M79642 Pain in left hand: Secondary | ICD-10-CM

## 2016-04-04 MED ORDER — LIDOCAINE HCL 1 % IJ SOLN
1.0000 mL | INTRAMUSCULAR | Status: AC | PRN
  Administered 2016-04-04: 1 mL

## 2016-04-04 MED ORDER — METHYLPREDNISOLONE ACETATE 40 MG/ML IJ SUSP
40.0000 mg | INTRAMUSCULAR | Status: AC | PRN
  Administered 2016-04-04: 40 mg

## 2016-04-04 NOTE — Progress Notes (Addendum)
Office Visit Note   Patient: Stacie GlazeWayne E Picking           Date of Birth: March 26, 1976           MRN: [id number] Visit Date: 04/04/2016              Requested by: Kirstie PeriAshish Shah, MD [address] [place], [place] [id number] PCP: Kirstie PeriSHAH,ASHISH, MD   Assessment & Plan: Visit Diagnoses:  1. Pain in left hand   2. Pain of left hand   Local tenderness between third and fourth mid metacarpal without evidence of fracture  Plan: Follow-up when necessary  Follow-Up Instructions: No Follow-up on file.   Orders:  No orders of the defined types were placed in this encounter.  No orders of the defined types were placed in this encounter.     Procedures: Hand/UE Inj Date/Time: 04/04/2016 11:04 AM Performed by: Valeria BatmanWHITFIELD, PETER W Authorized by: Valeria BatmanWHITFIELD, PETER W   Consent Given by:  Patient Timeout: prior to procedure the correct patient, procedure, and site was verified   Indications:  Pain Condition: dorsal carpal ganglion   Condition comment:  Intermetacarpal pain Prep: patient was prepped and draped in usual sterile fashion   Needle Size:  27 G Approach:  Dorsal Medications:  1 mL lidocaine 1 %; 40 mg methylPREDNISolone acetate 40 MG/ML       Clinical Data: No additional findings.   Subjective: Chief Complaint  Patient presents with  . Left Hand - Pain    Left hand Pain OA, xrays obtained   Mr. Dewaine CongerBarker relates initial onset of pain left hand when working with a piece of wood. Several days later he had another injury to his hand with localized discomfort between the third and fourth metacarpals and a "shooting" pain along the dorsum of the hand and wrist to the forearm. He denies ecchymosis or erythema. His not lost any motion. Denies numbness or tingling. Review of Systems   Objective: Vital Signs: There were no vitals taken for this visit.  Physical Exam  Ortho Exam Exam of the left hand demonstrates no ecchymosis or erythema. There is a localized tenderness between the  third and fourth metacarpals at the mid metacarpal level. No swelling. Full range of motion of wrist and fingers. No localized tenderness over either the third or fourth metacarpal. No mass demonstrated. Neurovascular intact. Specialty Comments:  No specialty comments available.  Imaging: No results found.   PMFS History: Patient Active Problem List   Diagnosis Date Noted  . COPD, moderate (HCC) 07/27/2015  . Nodule of right lung 07/27/2015  . Lung nodule 02/03/2015  . Smoking history 12/23/2014  . History of COPD 12/23/2014  . Sepsis (HCC) 08/10/2013  . Abdominal pain, RUQ (right upper quadrant) 08/09/2013  . Back pain 08/06/2013  . Aftercare following surgery of the circulatory system, NEC 01/29/2013  . COPD exacerbation (HCC) 12/12/2012  . Abdominal pain 08/22/2012  . Fever 08/22/2012  . Cholecystitis 08/22/2012  . PAD (peripheral artery disease) (HCC) 09/07/2011  . Atherosclerosis of native arteries of the extremities with intermittent claudication 08/02/2011  . CAD (coronary artery disease) 05/25/2011  . COPD (chronic obstructive pulmonary disease) (HCC) 05/18/2011  . Carotid bruit 08/28/2010  . PVD (peripheral vascular disease) (HCC)   . UNSPECIFIED HYPOTENSION 06/22/2010  . PVD 12/14/2008  . HYPERLIPIDEMIA 09/23/2008  . HYPERTENSION, BENIGN 09/23/2008  . ICD OR LEAD INFECTION 09/23/2008   Past Medical History:  Diagnosis Date  . Anxiety   . Arthritis    "minor;  right shoulder; both knees" (08/21/2012)  . Asthma   . CAD (coronary artery disease)   . Chest pain    a. s/p multiple catheterizations with nonobstructive CAD  b. April 09, 2008, cardiac catheterization, left main  normal, LAD 30% with mild plaquing throughout.  Mild disease in  the first diagonal.  Left circumflex 30% proximal.  OM2 30%, mid.  30-40% tubular stenosis throughout the proximal RCA.  EF 50-55%. There was significant abdominal aortic plaquing noted on  . Chest pain    "for years; undiagnosed;  undetermined cause" (08/21/2012)  . Complication of anesthesia    "BP really drops; have trouble getting his BP up; forgets to breath when coming out of anesthesia" (08/21/2012)  . COPD (chronic obstructive pulmonary disease) (HCC)   . Dizziness   . Exertional shortness of breath    "last couple of weeks; dr thinks it may be the BP RX" (08/21/2012)  . GERD (gastroesophageal reflux disease)   . Hiatal hernia   . History of blood transfusion 2008; 2012   "related to ORs" (08/21/2012)  . Hyperlipidemia   . Hypertension   . Leg pain with walking  . PVD (peripheral vascular disease) (HCC)    PVD-recent stent right common iliac artery. s/p L->R fem -fem angioplasty  . Sinus headache    "related to pollen" (08/21/2012)  . Ulcerative colitis    History of ulcerative colitis status post complete colectomy.    Family History  Problem Relation Age of Onset  . Heart disease Father     Heart Disease before age 41  . Heart attack Father   . Leukemia Mother   . Cancer Mother   . Bone cancer Sister   . Cancer Sister   . Breast cancer Sister   . Brain cancer Brother   . Cancer Brother   . Deep vein thrombosis Brother   . Brain cancer Brother   . Brain cancer Sister     Past Surgical History:  Procedure Laterality Date  . ABCESS DRAINAGE  2009   "twice for abdominal abscess" (08/21/2012)  . ABDOMINAL AORTAGRAM N/A 06/12/2013   Procedure: ABDOMINAL Ronny FlurryAORTAGRAM;  Surgeon: Sherren Kernsharles E Fields, MD;  [facility];  Service: Cardiovascular;  Laterality: N/A;  . APPENDECTOMY    . CARDIAC CATHETERIZATION  2014  . CHOLECYSTECTOMY N/A 08/22/2012   Procedure: LAPAROSCOPIC CHOLECYSTECTOMY WITH INTRAOPERATIVE CHOLANGIOGRAM;  Surgeon: Shelly Rubensteinouglas A Blackman, MD;  Location: [facility] OR;  Service: General;  Laterality: N/A;  . CLOSED MANIPULATION SHOULDER Right 12/2011   "manipulation" (08/21/2012)  . COLECTOMY  2008   "wore colostomy bag for ~ 4 months" (08/21/2012)  . COLOPROCTECTOMY W/ ILEO J POUCH  2008   "reconnected  it to his rectum in 2009" (08/21/2012)  . COLOSTOMY REVERSAL  2009  . distal aortogram with bilateral lower extremity runoff    . ESOPHAGOGASTRODUODENOSCOPY N/A 08/11/2013   Procedure: ESOPHAGOGASTRODUODENOSCOPY (EGD);  Surgeon: Theda BelfastPatrick D Hung, MD;  [facility];  Service: Endoscopy;  Laterality: N/A;  . ESOPHAGOGASTRODUODENOSCOPY N/A 08/20/2013   Procedure: ESOPHAGOGASTRODUODENOSCOPY (EGD);  Surgeon: Theda BelfastPatrick D Hung, MD;  [facility];  Service: Endoscopy;  Laterality: N/A;  . FEMORAL ARTERY - FEMORAL ARTERY BYPASS GRAFT  2011, 2012  . FEMORAL ENDARTERECTOMY Bilateral 2011   external iliac common/notes 08/21/2012  . HERNIA REPAIR  2010  . ILIAC ARTERY STENT Right 12/2009?   Percutaneous transluminal angioplasty of the right common iliac artery with placement of a stent]  . INGUINAL HERNIA REPAIR Left 1990s,   .  KNEE ARTHROSCOPY Right 2007; 12/2010; 02/2011  . LOWER EXTREMITY ANGIOGRAM Bilateral 06/12/2013   Procedure: LOWER EXTREMITY ANGIOGRAM;  Surgeon: Sherren Kerns, MD;  Location: [facility] C[facility] LAB;  Service: Cardiovascular;  Laterality: Bilateral;  . PATCH ANGIOPLASTY Left 05/2010   superficial femoral artery/notes 08/21/2012  . PR VEIN BYPASS GRAFT,AORTO-FEM-POP  2012  . SHOULDER ARTHROSCOPY Right 1980s, 2005   Social History   Occupational History  . retired from [employer]. mgmt. Retired   Social History Main Topics  . Smoking status: Former Smoker    Packs/day: 2.00    Years: 40.00    Types: Cigarettes    Quit date: 07/23/2003  . Smokeless tobacco: Never Used  . Alcohol use No     Comment: 08/21/2012 "used to drink; never had problem w/it; stopped in 1980's"  . Drug use: No  . Sexual activity: Yes

## 2017-11-26 ENCOUNTER — Emergency Department (HOSPITAL_COMMUNITY): Payer: Self-pay

## 2017-11-26 ENCOUNTER — Emergency Department (HOSPITAL_COMMUNITY)
Admission: EM | Admit: 2017-11-26 | Discharge: 2017-11-26 | Disposition: A | Discharge status: Home / Self Care | Payer: Self-pay | Attending: Emergency Medicine | Admitting: Emergency Medicine

## 2017-11-26 ENCOUNTER — Other Ambulatory Visit: Payer: Self-pay

## 2017-11-26 ENCOUNTER — Encounter (HOSPITAL_COMMUNITY): Payer: Self-pay | Admitting: Emergency Medicine

## 2017-11-26 DIAGNOSIS — W010XXA Fall on same level from slipping, tripping and stumbling without subsequent striking against object, initial encounter: Secondary | ICD-10-CM | POA: Insufficient documentation

## 2017-11-26 DIAGNOSIS — S90411A Abrasion, right great toe, initial encounter: Secondary | ICD-10-CM | POA: Insufficient documentation

## 2017-11-26 DIAGNOSIS — S9031XA Contusion of right foot, initial encounter: Secondary | ICD-10-CM | POA: Insufficient documentation

## 2017-11-26 DIAGNOSIS — Y929 Unspecified place or not applicable: Secondary | ICD-10-CM | POA: Insufficient documentation

## 2017-11-26 DIAGNOSIS — Y999 Unspecified external cause status: Secondary | ICD-10-CM | POA: Insufficient documentation

## 2017-11-26 DIAGNOSIS — S90414A Abrasion, right lesser toe(s), initial encounter: Secondary | ICD-10-CM

## 2017-11-26 DIAGNOSIS — Y9389 Activity, other specified: Secondary | ICD-10-CM | POA: Insufficient documentation

## 2017-11-26 MED ORDER — POVIDONE-IODINE 10 % EX SOLN: CUTANEOUS | Status: DC | PRN

## 2017-11-26 MED ORDER — HYDROCODONE-ACETAMINOPHEN 5-325 MG PO TABS: 1.0000 | ORAL_TABLET | ORAL | 0 refills | Status: DC | PRN

## 2017-11-26 MED ORDER — LIDOCAINE HCL (PF) 2 % IJ SOLN
INTRAMUSCULAR | Status: AC
  Filled 2017-11-26: qty 20

## 2017-11-26 MED ORDER — HYDROCODONE-ACETAMINOPHEN 5-325 MG PO TABS
1.0000 | ORAL_TABLET | Freq: Once | ORAL | Status: AC
  Administered 2017-11-26: 1 via ORAL
  Filled 2017-11-26: qty 1

## 2017-11-26 MED ORDER — POVIDONE-IODINE 10 % EX SOLN
CUTANEOUS | Status: AC
  Filled 2017-11-26: qty 15

## 2017-11-26 MED ORDER — LIDOCAINE HCL (PF) 2 % IJ SOLN
10.0000 mL | Freq: Once | INTRAMUSCULAR | Status: AC
Start: 2017-11-26 — End: 2017-11-26
  Administered 2017-11-26: 10 mL

## 2017-11-26 NOTE — ED Notes (Signed)
Pt verbalized understanding of no driving and to use caution within 4 hours of taking pain meds due to meds cause drowsiness 

## 2017-11-26 NOTE — ED Triage Notes (Signed)
Patient states he jumped over some debris of wood and hit his right big toe, which pealed back the skin from the tip of his toe.

## 2017-11-26 NOTE — ED Provider Notes (Signed)
[facility] EMERGENCY DEPARTMENT Provider Note   CSN: [id number] Arrival date & time: 11/26/17  1626     History   Chief Complaint Chief Complaint  Patient presents with  . Toe Injury    HPI Samuel [name] is a 42 y.o. male presenting with severe right heel pain after a direct blow.  He jumped over a stack of shingles (works as a Designer, fashion/clothingroofer) and landed directly on cement with the right heel and has not been able to weight bear since due to severe pain.  Additionally he scraped his right great toe tip and sustained a slough injury to the distal tip.  He has had no treatment prior to arrival.  He is utd with his tetanus vaccines. Marland Kitchen.  HPI  History reviewed. No pertinent past medical history.  There are no active problems to display for this patient.   Past Surgical History:  Procedure Laterality Date  . ADENOIDECTOMY    . TONSILLECTOMY          Home Medications    Prior to Admission medications   Medication Sig Start Date End Date Taking? Authorizing Provider  HYDROcodone-acetaminophen (NORCO/VICODIN) 5-325 MG tablet Take 1 tablet by mouth every 4 (four) hours as needed. 11/26/17   Burgess AmorIdol, Cord Wilczynski, PA-C  ibuprofen (ADVIL,MOTRIN) 200 MG tablet Take 200 mg by mouth every 6 (six) hours as needed.    [provider]    Family History History reviewed. No pertinent family history.  Social History Social History   Tobacco Use  . Smoking status: Never Smoker  . Smokeless tobacco: Never Used  Substance Use Topics  . Alcohol use: Never    Frequency: Never  . Drug use: Never     Allergies   Bee venom; Penicillins; and Phenergan [promethazine hcl]   Review of Systems Review of Systems  Constitutional: Negative for fever.  Musculoskeletal: Positive for arthralgias. Negative for joint swelling and myalgias.  Skin: Positive for wound.  Neurological: Negative for weakness and numbness.     Physical Exam Updated Vital Signs BP 117/85 (BP Location: Left Arm)    Pulse 74   Temp 98.3 F (36.8 C) (Oral)   Resp 16   Ht 6\' 1"  (1.854 m)   Wt 74.8 kg (165 lb)   SpO2 100%   BMI 21.77 kg/m   Physical Exam  Constitutional: He appears well-developed and well-nourished.  HENT:  Head: Atraumatic.  Neck: Normal range of motion.  Cardiovascular:  Pulses equal bilaterally  Musculoskeletal: He exhibits tenderness.       Right foot: There is bony tenderness. There is no swelling, normal capillary refill, no crepitus and no deformity.       Feet:  ttp plantar heel. No visible trauma or deformity.  Superficial avulsion/abrasion right distal great toe, no nail plate involvement.  Neurological: He is alert. He has normal strength. He displays normal reflexes. No sensory deficit.  Skin: Skin is warm and dry.  Psychiatric: He has a normal mood and affect.     ED Treatments / Results  Labs (all labs ordered are listed, but only abnormal results are displayed) Labs Reviewed - No data to display  EKG None  Radiology Dg Foot Complete Right  Result Date: 11/26/2017 CLINICAL DATA:  Right heel pain after injury at work. EXAM: RIGHT FOOT COMPLETE - 3+ VIEW COMPARISON:  None. FINDINGS: There is no evidence of fracture or dislocation. There is no evidence of arthropathy or other focal bone abnormality. Soft tissues are unremarkable. IMPRESSION:  Normal right foot. Electronically Signed   By: Lupita Raider, M.D.   On: 11/26/2017 18:17    Procedures Procedures (including critical care time)  LACERATION REPAIR Performed by: Burgess Amor Authorized by: Burgess Amor Consent: Verbal consent obtained. Risks and benefits: risks, benefits and alternatives were discussed Consent given by: patient Patient identity confirmed: provided demographic data Prepped and Draped in normal sterile fashion Wound explored  Laceration Location: right great toe  Laceration Length: 2cm  No Foreign Bodies seen or palpated  Anesthesia: digital block  Local anesthetic:  lidocaine 2% without epinephrine  Anesthetic total: 4 ml  Irrigation method: syringe Amount of cleaning: copious, using betadine the 4x4 scrub with saline  Skin closure: no closure,  Superficial flap which was debrided  Number of sutures: n/a  Technique: dressing applied, xeroform, then gauze.  Patient tolerance: Patient tolerated the procedure well with no immediate complications.   Medications Ordered in ED Medications  lidocaine (XYLOCAINE) 2 % injection (has no administration in time range)  povidone-iodine (BETADINE) 10 % external solution (has no administration in time range)  povidone-iodine (BETADINE) 10 % external solution (has no administration in time range)  lidocaine (XYLOCAINE) 2 % injection 10 mL (10 mLs Other Given by Other 11/26/17 1743)  HYDROcodone-acetaminophen (NORCO/VICODIN) 5-325 MG per tablet 1 tablet (1 tablet Oral Given 11/26/17 1743)     Initial Impression / Assessment and Plan / ED Course  I have reviewed the triage vital signs and the nursing notes.  Pertinent labs & imaging results that were available during my care of the patient were reviewed by me and considered in my medical decision making (see chart for details).     Wound care instructions given, ice elevation, aleve.  hydrocodone for additional pain relief.  Offered crutches for pain relief, pt has, so deferred. Plan f/u with pcp prn.   Imaging reviewed and discussed, no fx.  Pt tetanus current.  Final Clinical Impressions(s) / ED Diagnoses   Final diagnoses:  Contusion of foot or heel, right, initial encounter  Toe abrasion, right, initial encounter    ED Discharge Orders        Ordered    HYDROcodone-acetaminophen (NORCO/VICODIN) 5-325 MG tablet  Every 4 hours PRN     11/26/17 1850       Victoriano Lain 11/26/17 1857    Mesner, Barbara Cower, MD 11/26/17 2130

## 2017-11-26 NOTE — Discharge Instructions (Addendum)
Wash your toe wound using gentle soap and water starting tomorrow evening, then twice daily, dry completely then reapply a new dressing, with application of the no stick layer first.  You can stop doing this once a good scab forms as discussed.  Your xrays are negative for fractures.  You may take the hydrocodone prescribed for pain relief.  This will make you drowsy - do not drive within 4 hours of taking this medication.  You may also use your home aleve for additional pain relief.

## 2020-12-30 ENCOUNTER — Emergency Department (HOSPITAL_COMMUNITY): Payer: Self-pay

## 2020-12-30 ENCOUNTER — Inpatient Hospital Stay (HOSPITAL_COMMUNITY)
Admission: EM | Admit: 2020-12-30 | Discharge: 2020-12-31 | DRG: 392 | Disposition: A | Discharge status: Home / Self Care | Payer: Self-pay | Attending: Internal Medicine | Admitting: Internal Medicine

## 2020-12-30 ENCOUNTER — Encounter (HOSPITAL_COMMUNITY): Payer: Self-pay | Admitting: *Deleted

## 2020-12-30 ENCOUNTER — Other Ambulatory Visit: Payer: Self-pay

## 2020-12-30 DIAGNOSIS — E871 Hypo-osmolality and hyponatremia: Secondary | ICD-10-CM | POA: Diagnosis present

## 2020-12-30 DIAGNOSIS — D72829 Elevated white blood cell count, unspecified: Secondary | ICD-10-CM

## 2020-12-30 DIAGNOSIS — E860 Dehydration: Secondary | ICD-10-CM | POA: Diagnosis present

## 2020-12-30 DIAGNOSIS — K529 Noninfective gastroenteritis and colitis, unspecified: Principal | ICD-10-CM | POA: Diagnosis present

## 2020-12-30 DIAGNOSIS — R053 Chronic cough: Secondary | ICD-10-CM | POA: Diagnosis present

## 2020-12-30 DIAGNOSIS — Z888 Allergy status to other drugs, medicaments and biological substances status: Secondary | ICD-10-CM

## 2020-12-30 DIAGNOSIS — R7401 Elevation of levels of liver transaminase levels: Secondary | ICD-10-CM | POA: Diagnosis present

## 2020-12-30 DIAGNOSIS — F1290 Cannabis use, unspecified, uncomplicated: Secondary | ICD-10-CM | POA: Diagnosis present

## 2020-12-30 DIAGNOSIS — R531 Weakness: Secondary | ICD-10-CM

## 2020-12-30 DIAGNOSIS — R748 Abnormal levels of other serum enzymes: Secondary | ICD-10-CM | POA: Diagnosis present

## 2020-12-30 DIAGNOSIS — Z20822 Contact with and (suspected) exposure to covid-19: Secondary | ICD-10-CM | POA: Diagnosis present

## 2020-12-30 DIAGNOSIS — E876 Hypokalemia: Secondary | ICD-10-CM | POA: Diagnosis present

## 2020-12-30 DIAGNOSIS — R112 Nausea with vomiting, unspecified: Principal | ICD-10-CM

## 2020-12-30 DIAGNOSIS — R509 Fever, unspecified: Secondary | ICD-10-CM | POA: Diagnosis present

## 2020-12-30 DIAGNOSIS — R197 Diarrhea, unspecified: Secondary | ICD-10-CM

## 2020-12-30 DIAGNOSIS — F12188 Cannabis abuse with other cannabis-induced disorder: Secondary | ICD-10-CM

## 2020-12-30 LAB — CBC
HCT: 39.9 % (ref 39.0–52.0)
Hemoglobin: 14 g/dL (ref 13.0–17.0)
MCH: 31.3 pg (ref 26.0–34.0)
MCHC: 35.1 g/dL (ref 30.0–36.0)
MCV: 89.3 fL (ref 80.0–100.0)
Platelets: 294 10*3/uL (ref 150–400)
RBC: 4.47 MIL/uL (ref 4.22–5.81)
RDW: 13.3 % (ref 11.5–15.5)
WBC: 12.4 10*3/uL — ABNORMAL HIGH (ref 4.0–10.5)
nRBC: 0 % (ref 0.0–0.2)

## 2020-12-30 LAB — URINALYSIS, MICROSCOPIC (REFLEX)
Bacteria, UA: NONE SEEN
Squamous Epithelial / HPF: NONE SEEN (ref 0–5)

## 2020-12-30 LAB — URINALYSIS, ROUTINE W REFLEX MICROSCOPIC
Bilirubin Urine: NEGATIVE
Glucose, UA: NEGATIVE mg/dL
Ketones, ur: 40 mg/dL — AB
Leukocytes,Ua: NEGATIVE
Nitrite: NEGATIVE
Protein, ur: 30 mg/dL — AB
Specific Gravity, Urine: 1.025 (ref 1.005–1.030)
pH: 6 (ref 5.0–8.0)

## 2020-12-30 LAB — CK: Total CK: 818 U/L — ABNORMAL HIGH (ref 49–397)

## 2020-12-30 LAB — COMPREHENSIVE METABOLIC PANEL
ALT: 29 U/L (ref 0–44)
AST: 55 U/L — ABNORMAL HIGH (ref 15–41)
Albumin: 4.9 g/dL (ref 3.5–5.0)
Alkaline Phosphatase: 54 U/L (ref 38–126)
Anion gap: 11 (ref 5–15)
BUN: 30 mg/dL — ABNORMAL HIGH (ref 6–20)
CO2: 24 mmol/L (ref 22–32)
Calcium: 9 mg/dL (ref 8.9–10.3)
Chloride: 97 mmol/L — ABNORMAL LOW (ref 98–111)
Creatinine, Ser: 1.04 mg/dL (ref 0.61–1.24)
GFR, Estimated: >60 mL/min (ref >60)
Glucose, Bld: 123 mg/dL — ABNORMAL HIGH (ref 70–99)
Potassium: 3.6 mmol/L (ref 3.5–5.1)
Sodium: 132 mmol/L — ABNORMAL LOW (ref 135–145)
Total Bilirubin: 0.9 mg/dL (ref 0.3–1.2)
Total Protein: 8.5 g/dL — ABNORMAL HIGH (ref 6.5–8.1)

## 2020-12-30 LAB — LIPASE, BLOOD: Lipase: 29 U/L (ref 11–51)

## 2020-12-30 LAB — RESP PANEL BY RT-PCR (FLU A&B, COVID) ARPGX2
Influenza A by PCR: NEGATIVE
Influenza B by PCR: NEGATIVE
SARS Coronavirus 2 by RT PCR: NEGATIVE

## 2020-12-30 MED ORDER — METOCLOPRAMIDE HCL 5 MG/ML IJ SOLN
10.0000 mg | Freq: Once | INTRAMUSCULAR | Status: AC
  Administered 2020-12-30: 10 mg via INTRAVENOUS
  Filled 2020-12-30: qty 2

## 2020-12-30 MED ORDER — KETOROLAC TROMETHAMINE 30 MG/ML IJ SOLN
30.0000 mg | Freq: Once | INTRAMUSCULAR | Status: AC
  Administered 2020-12-30: 30 mg via INTRAVENOUS
  Filled 2020-12-30: qty 1

## 2020-12-30 MED ORDER — FAMOTIDINE IN NACL 20-0.9 MG/50ML-% IV SOLN
20.0000 mg | Freq: Once | INTRAVENOUS | Status: AC
  Administered 2020-12-30: 20 mg via INTRAVENOUS
  Filled 2020-12-30: qty 50

## 2020-12-30 MED ORDER — SODIUM CHLORIDE 0.9 % IV BOLUS
1000.0000 mL | Freq: Once | INTRAVENOUS | Status: AC
Start: 2020-12-30 — End: 2020-12-30
  Administered 2020-12-30: 1000 mL via INTRAVENOUS

## 2020-12-30 MED ORDER — MORPHINE SULFATE (PF) 2 MG/ML IV SOLN
2.0000 mg | Freq: Once | INTRAVENOUS | Status: AC
  Administered 2020-12-30: 2 mg via INTRAVENOUS
  Filled 2020-12-30: qty 1

## 2020-12-30 MED ORDER — ENOXAPARIN SODIUM 40 MG/0.4ML IJ SOSY
40.0000 mg | PREFILLED_SYRINGE | INTRAMUSCULAR | Status: DC
  Administered 2020-12-31: 40 mg via SUBCUTANEOUS
  Filled 2020-12-30: qty 0.4

## 2020-12-30 MED ORDER — ONDANSETRON HCL 4 MG/2ML IJ SOLN
4.0000 mg | Freq: Once | INTRAMUSCULAR | Status: AC
  Administered 2020-12-30: 4 mg via INTRAVENOUS
  Filled 2020-12-30: qty 2

## 2020-12-30 MED ORDER — IOHEXOL 350 MG/ML SOLN
80.0000 mL | Freq: Once | INTRAVENOUS | Status: AC | PRN
  Administered 2020-12-30: 80 mL via INTRAVENOUS

## 2020-12-30 NOTE — ED Triage Notes (Signed)
States he has been vomiting for the past 3 days due to heat exhaustion

## 2020-12-30 NOTE — ED Provider Notes (Signed)
[facility] EMERGENCY DEPARTMENT Provider Note   CSN: [id number] Arrival date & time: 12/30/20  1407     History CC: Diarrhea, vomiting   Samuel [name] is a 45 y.o. male presenting to emergency department with diarrhea, fatigue, fevers for 3 days.  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  He thought he may have heat exhaustion as he began having vomiting, myalgias, fevers and chills that night.  The symptoms have persisted for 3 days.  He describes diffuse muscle aches, watery bowel movements, vomiting, cannot keep down any food or fluids.  He denies headache or sore throat.  He reports he is a chronic cough.  He states he took a home COVID test which was negative yesterday.  He denies any other medical problems and does not take any medications at baseline.  Patient does report to me a history of cyclical vomiting, most recently 5 years ago.  He said he has been good in 5 years not had any further episodes.  At that time however he was requiring hospitalization often and states that "it would give me something under the tongue that helped with spasms in my gut" but denies that this was Zofran.  He reports allergies to Phenergan.  HPI     History reviewed. No pertinent past medical history.  There are no problems to display for this patient.   Past Surgical History:  Procedure Laterality Date   ADENOIDECTOMY     TONSILLECTOMY         No family history on file.  Social History   Tobacco Use   Smoking status: Never   Smokeless tobacco: Never  Substance Use Topics   Alcohol use: Never   Drug use: Never    Home Medications Prior to Admission medications   Medication Sig Start Date End Date Taking? Authorizing Provider  EPINEPHrine 0.3 mg/0.3 mL IJ SOAJ injection Inject into the muscle. 10/07/19  Yes [provider]  Docusate Sodium (DSS) 100 MG CAPS Take by mouth. Patient not taking: No sig reported  06/23/20 06/23/21  [provider]  HYDROcodone-acetaminophen (NORCO/VICODIN) 5-325 MG tablet Take 1 tablet by mouth every 4 (four) hours as needed. Patient not taking: No sig reported 11/26/17   Burgess Amor, PA-C  ibuprofen (ADVIL,MOTRIN) 200 MG tablet Take 200 mg by mouth every 6 (six) hours as needed. Patient not taking: No sig reported    [provider]    Allergies    Bee venom, Penicillins, and Phenergan [promethazine hcl]  Review of Systems   Review of Systems  Constitutional:  Positive for appetite change and fatigue. Negative for chills and fever.  Eyes:  Negative for pain and visual disturbance.  Respiratory:  Negative for cough and shortness of breath.   Cardiovascular:  Negative for chest pain and palpitations.  Gastrointestinal:  Positive for diarrhea, nausea and vomiting.  Genitourinary:  Negative for dysuria and hematuria.  Musculoskeletal:  Positive for arthralgias and myalgias.  Skin:  Negative for color change and rash.  Neurological:  Negative for syncope and headaches.  All other systems reviewed and are negative.  Physical Exam Updated Vital Signs BP (!) 141/90   Pulse 67   Temp 99 F (37.2 C) (Oral)   Resp 12   Ht 6\' 2"  (1.88 m)   Wt 70.3 kg   SpO2 100%   BMI 19.90 kg/m   Physical Exam Constitutional:  General: He is not in acute distress. HENT:     Head: Normocephalic and atraumatic.  Eyes:     Conjunctiva/sclera: Conjunctivae normal.     Pupils: Pupils are equal, round, and reactive to light.  Cardiovascular:     Rate and Rhythm: Normal rate and regular rhythm.  Pulmonary:     Effort: Pulmonary effort is normal. No respiratory distress.  Abdominal:     General: There is no distension.     Tenderness: There is no abdominal tenderness.  Skin:    General: Skin is warm and dry.  Neurological:     General: No focal deficit present.     Mental Status: He is alert and oriented to person, place, and time. Mental status is at  baseline.  Psychiatric:        Mood and Affect: Mood normal.        Behavior: Behavior normal.    ED Results / Procedures / Treatments   Labs (all labs ordered are listed, but only abnormal results are displayed) Labs Reviewed  COMPREHENSIVE METABOLIC PANEL - Abnormal; Notable for the following components:      Result Value   Sodium 132 (*)    Chloride 97 (*)    Glucose, Bld 123 (*)    BUN 30 (*)    Total Protein 8.5 (*)    AST 55 (*)    All other components within normal limits  CBC - Abnormal; Notable for the following components:   WBC 12.4 (*)    All other components within normal limits  RESP PANEL BY RT-PCR (FLU A&B, COVID) ARPGX2  LIPASE, BLOOD  URINALYSIS, ROUTINE W REFLEX MICROSCOPIC  CK    EKG EKG Interpretation  Date/Time:  Friday December 30 2020 20:13:40 EDT Ventricular Rate:  78 PR Interval:  130 QRS Duration: 96 QT Interval:  388 QTC Calculation: 442 R Axis:   65 Text Interpretation: Sinus rhythm Confirmed by Alvester Chou [phone]) on 12/30/2020 10:50:51 PM  Radiology CT ABDOMEN PELVIS W CONTRAST  Result Date: 12/30/2020 CLINICAL DATA:  Diarrhea, fever, nausea and vomiting for 3 days EXAM: CT ABDOMEN AND PELVIS WITH CONTRAST TECHNIQUE: Multidetector CT imaging of the abdomen and pelvis was performed using the standard protocol following bolus administration of intravenous contrast. CONTRAST:  86mL OMNIPAQUE IOHEXOL 350 MG/ML SOLN COMPARISON:  08/14/2003 FINDINGS: Lower chest: No acute pleural or parenchymal lung disease. Hepatobiliary: No focal liver abnormality is seen. No gallstones, gallbladder wall thickening, or biliary dilatation. Pancreas: Unremarkable. No pancreatic ductal dilatation or surrounding inflammatory changes. Spleen: Normal in size without focal abnormality. Adrenals/Urinary Tract: Adrenal glands are unremarkable. Kidneys are normal, without renal calculi, focal lesion, or hydronephrosis. Bladder is unremarkable. Stomach/Bowel: No bowel  obstruction or ileus. Normal appendix right lower quadrant. No bowel wall thickening or inflammatory change. Vascular/Lymphatic: No significant vascular findings are present. No enlarged abdominal or pelvic lymph nodes. Reproductive: Prostate is unremarkable. Other: No free fluid or free gas.  No abdominal wall hernia. Musculoskeletal: No acute or destructive bony lesions. Reconstructed images demonstrate no additional findings. IMPRESSION: 1. No acute intra-abdominal or intrapelvic process. Electronically Signed   By: Sharlet Salina M.D.   On: 12/30/2020 22:11    Procedures Procedures   Medications Ordered in ED Medications  sodium chloride 0.9 % bolus 1,000 mL (0 mLs Intravenous Stopped 12/30/20 1903)  ketorolac (TORADOL) 30 MG/ML injection 30 mg (30 mg Intravenous Given 12/30/20 1649)  ondansetron (ZOFRAN) injection 4 mg (4 mg Intravenous Given 12/30/20 1647)  morphine 2 MG/ML  injection 2 mg (2 mg Intravenous Given 12/30/20 1647)  famotidine (PEPCID) IVPB 20 mg premix (0 mg Intravenous Stopped 12/30/20 2005)  metoCLOPramide (REGLAN) injection 10 mg (10 mg Intravenous Given 12/30/20 1906)  iohexol (OMNIPAQUE) 350 MG/ML injection 80 mL (80 mLs Intravenous Contrast Given 12/30/20 2045)    ED Course  I have reviewed the triage vital signs and the nursing notes.  Pertinent labs & imaging results that were available during my care of the patient were reviewed by me and considered in my medical decision making (see chart for details).  Patient's differential diagnosis includes dehydration versus heat exhaustion versus viral illness including COVID versus other infection.  He has no headache, no other signs or symptoms of meningitis.  He is afebrile and not tachycardic on arrival.  I reviewed his lab work here.  His CMP does show some possible mild dehydration with an elevated BUN of 30, creatinine of 1.0.  No AKI.  CBC also shows mildly elevated white blood cell count at 12.4 with a normal hemoglobin, but  this may be hemoconcentrated.  Lipase is normal.  I have added on a COVID PCR, as it is possibly a false negative at home.  I have also ordered some IV fluids and additional medications for myalgias.  Clinically I doubt rhabdomyolysis.  Labs reviewed and overall unremarkable.  Multiple IV medications were attempted with no improvement of his vomiting and persistent nausea.  The patient does report that he has had episodes of cyclical vomiting like this in the past, most recently 5 years ago.  Clinical Course as of 12/30/20 2302  Fri Dec 30, 2020  1902 Still nauseous and vomiting.  I have ordered IV Reglan and IV Pepcid [MT]  2215   IMPRESSION: 1. No acute intra-abdominal or intrapelvic process. [MT]  2215 No acute findings on CT.  Patient still has intractable vomiting and nausea.  He reports that he had a similar episode in history in the past requiring hospitalization.  Plan for hospital admission. [MT]  2256 Admitted to hospitalist [MT]    Clinical Course User Index [MT] Ayeisha Lindenberger, Kermit Balo, MD    Final Clinical Impression(s) / ED Diagnoses Final diagnoses:  Intractable vomiting with nausea, unspecified vomiting type    Rx / DC Orders ED Discharge Orders     None        Terald Sleeper, MD 12/30/20 2302

## 2020-12-30 NOTE — ED Notes (Signed)
CT called requesting update on pt to return for CT. Per CT tech pt to be next

## 2020-12-30 NOTE — ED Provider Notes (Signed)
Emergency Medicine Provider Triage Evaluation Note  Samuel [name] , a 45 y.o. male  was evaluated in triage.  Pt complains of diarrhea, fever, vomiting, nausea x3 days.  Has not tried any alleviating factors, denies any worsening factors.  Patient thinks it heat exhaustion because of started when he was working on the roof in the sun.  Denies any alcohol or drug use..  Review of Systems  Positive: N, V, D Negative: Confusion  Physical Exam  BP (!) 157/108 (BP Location: Right Arm)   Pulse 79   Temp 97.6 F (36.4 C) (Oral)   Resp 20   Ht 6\' 2"  (1.88 m)   Wt 70.3 kg   SpO2 100%   BMI 19.90 kg/m  Gen:   Awake, moaning  Resp:  Normal effort  MSK:   Moves extremities without difficulty  Other:    Medical Decision Making  Medically screening exam initiated at 2:56 PM.  Appropriate orders placed.  [name] was informed that the remainder of the evaluation will be completed by another provider, this initial triage assessment does not replace that evaluation, and the importance of remaining in the ED until their evaluation is complete.     Stacie Glaze, PA-C 12/30/20 1457    03/01/21, MD 12/31/20 [phone]

## 2020-12-30 NOTE — ED Notes (Signed)
IV to right arm infiltrated while in CT. IV removed. New IV placed right arm.

## 2020-12-30 NOTE — ED Notes (Signed)
Patient transported to CT 

## 2020-12-30 NOTE — H&P (Signed)
History and Physical  Samuel [name] XHB:[id number] DOB: 1976/01/16 DOA: 12/30/2020  Referring physician: Terald Sleeper, MD  PCP: Samuel Mires, MD  Patient coming from: Home  Chief Complaint: Nausea, vomiting, diarrhea  HPI: Samuel [name] is a 45 y.o. male with no past medical history who presents to the emergency department accompanied by fiance due to 3-day onset of generalized weakness, fever, diarrhea.  Patient states that he was working as a Designer, fashion/clothing about 3 days ago and that it was a hot day, on his way home he started to feel fatigued and by the time he got home, he was nauseous and was vomiting, he states that it may be due to heat exhaustion and he complained of subjective fever, chills and muscle pain.  Vomiting was bilious in nature and was without blood, he complained of not being able to hold any food or fluid down, diarrhea was several episodes daily with last episode being yesterday.  Patient states that he took home COVID test yesterday and it was negative.  He endorsed abdominal soreness from recurrent vomiting, but denies chest pain, shortness of breath. Patient states that he had similar episode about 7 years ago  ED Course:  In the emergency department, he was hemodynamically stable.  Work-up in the ED showed leukocytosis, mild hyponatremia, CK8 180, urinalysis was unimpressive for UTI.  Influenza a, B, SARS coronavirus 2 was negative. CT abdomen and pelvis with contrast showed no acute intra-abdominal or intrapelvic process He was treated with Toradol, Pepcid, Reglan, morphine, Zofran and IV hydration was provided.  Hospitalist was asked to admit patient for further evaluation and management.  Review of Systems: Constitutional: Positive for weakness and appetite change. HENT: Negative for ear pain and sore throat.   Eyes: Negative for pain and visual disturbance.  Respiratory: Negative for cough, chest tightness and shortness of breath.   Cardiovascular: Negative  for chest pain and palpitations.  Gastrointestinal: Positive for nausea, vomiting and diarrhea. Endocrine: Negative for polyphagia and polyuria.  Genitourinary: Negative for decreased urine volume, dysuria, enuresis Musculoskeletal: Positive for muscle pain and joint pain.   Skin: Negative for color change and rash.  Allergic/Immunologic: Negative for immunocompromised state.  Neurological: Negative for tremors, syncope, speech difficulty  Hematological: Does not bruise/bleed easily.  All other systems reviewed and are negative   History reviewed. No pertinent past medical history. Past Surgical History:  Procedure Laterality Date   ADENOIDECTOMY     TONSILLECTOMY      Social History:  reports that he has never smoked. He has never used smokeless tobacco. He reports that he does not drink alcohol and does not use drugs.   Allergies  Allergen Reactions   Bee Venom Swelling   Penicillins Swelling   Phenergan [Promethazine Hcl] Other (See Comments)    "burning when they give it to me."    No family history on file.    Prior to Admission medications   Medication Sig Start Date End Date Taking? Authorizing Provider  EPINEPHrine 0.3 mg/0.3 mL IJ SOAJ injection Inject into the muscle. 10/07/19  Yes [provider]  Docusate Sodium (DSS) 100 MG CAPS Take by mouth. Patient not taking: No sig reported 06/23/20 06/23/21  [provider]  HYDROcodone-acetaminophen (NORCO/VICODIN) 5-325 MG tablet Take 1 tablet by mouth every 4 (four) hours as needed. Patient not taking: No sig reported 11/26/17   Burgess Amor, PA-C  ibuprofen (ADVIL,MOTRIN) 200 MG tablet Take 200 mg by mouth every 6 (six) hours as  needed. Patient not taking: No sig reported    [provider]    Physical Exam: BP 113/76   Pulse 85   Temp 98 F (36.7 C)   Resp 18   Ht 6\' 2"  (1.88 m)   Wt 70.3 kg   SpO2 99%   BMI 19.90 kg/m   General: 45 y.o. year-old male well developed well nourished in  no acute distress.  Alert and oriented x3. HEENT: Dry mucous membrane.  NCAT, EOMI Neck: Supple, trachea medial Cardiovascular: Regular rate and rhythm with no rubs or gallops.  No thyromegaly or JVD noted.  No lower extremity edema. 2/4 pulses in all 4 extremities. Respiratory: Clear to auscultation with no wheezes or rales. Good inspiratory effort. Abdomen: Soft, nontender nondistended with normal bowel sounds x4 quadrants. Muskuloskeletal: No cyanosis, clubbing or edema noted bilaterally Neuro: CN Samuel [name] intact, strength 5/5 x 4, sensation, reflexes intact Skin: No ulcerative lesions noted or rashes Psychiatry: Judgement and insight appear normal. Mood is appropriate for condition and setting          Labs on Admission:  Basic Metabolic Panel: Recent Labs  Lab 12/30/20 1501  NA 132*  K 3.6  CL 97*  CO2 24  GLUCOSE 123*  BUN 30*  CREATININE 1.04  CALCIUM 9.0   Liver Function Tests: Recent Labs  Lab 12/30/20 1501  AST 55*  ALT 29  ALKPHOS 54  BILITOT 0.9  PROT 8.5*  ALBUMIN 4.9   Recent Labs  Lab 12/30/20 1501  LIPASE 29   No results for input(s): AMMONIA in the last 168 hours. CBC: Recent Labs  Lab 12/30/20 1501  WBC 12.4*  HGB 14.0  HCT 39.9  MCV 89.3  PLT 294   Cardiac Enzymes: Recent Labs  Lab 12/30/20 1501  CKTOTAL 818*    BNP (last 3 results) No results for input(s): BNP in the last 8760 hours.  ProBNP (last 3 results) No results for input(s): PROBNP in the last 8760 hours.  CBG: No results for input(s): GLUCAP in the last 168 hours.  Radiological Exams on Admission: CT ABDOMEN PELVIS W CONTRAST  Result Date: 12/30/2020 CLINICAL DATA:  Diarrhea, fever, nausea and vomiting for 3 days EXAM: CT ABDOMEN AND PELVIS WITH CONTRAST TECHNIQUE: Multidetector CT imaging of the abdomen and pelvis was performed using the standard protocol following bolus administration of intravenous contrast. CONTRAST:  52mL OMNIPAQUE IOHEXOL 350 MG/ML SOLN  COMPARISON:  08/14/2003 FINDINGS: Lower chest: No acute pleural or parenchymal lung disease. Hepatobiliary: No focal liver abnormality is seen. No gallstones, gallbladder wall thickening, or biliary dilatation. Pancreas: Unremarkable. No pancreatic ductal dilatation or surrounding inflammatory changes. Spleen: Normal in size without focal abnormality. Adrenals/Urinary Tract: Adrenal glands are unremarkable. Kidneys are normal, without renal calculi, focal lesion, or hydronephrosis. Bladder is unremarkable. Stomach/Bowel: No bowel obstruction or ileus. Normal appendix right lower quadrant. No bowel wall thickening or inflammatory change. Vascular/Lymphatic: No significant vascular findings are present. No enlarged abdominal or pelvic lymph nodes. Reproductive: Prostate is unremarkable. Other: No free fluid or free gas.  No abdominal wall hernia. Musculoskeletal: No acute or destructive bony lesions. Reconstructed images demonstrate no additional findings. IMPRESSION: 1. No acute intra-abdominal or intrapelvic process. Electronically Signed   By: 08/16/2003 M.D.   On: 12/30/2020 22:11    EKG: I independently viewed the EKG done and my findings are as followed: Normal sinus rhythm at a rate of 78 bpm  Assessment/Plan Present on Admission:  Nausea and vomiting  Principal Problem:  Nausea and vomiting Active Problems:   Diarrhea   Elevated CK-MB level   Leukocytosis   Hyponatremia   Dehydration   Generalized weakness in the setting of nausea, vomiting, diarrhea Continue IV Zofran 4 mg every 6 hours as needed Continue IV hydration Patient has not had diarrhea since arrival to the ED. Consider stool culture if diarrhea continues Continue clear liquid diet and advance diet as tolerated  Dehydration Continue IV hydration  Leukocytosis possibly reactive WBC 12.4; continue to monitor WBC with morning labs  Hyponatremia possibly due to dehydration Na 132; continue IV hydration  Elevated  total CK CK 818, continue IV hydration and continue to monitor CK level   DVT prophylaxis: Lovenox  Code Status: Full code  Family Communication: Fiance at bedside (all questions answered to satisfaction)  Disposition Plan:  Patient is from:                        home Anticipated DC to:                   SNF or family members home Anticipated DC date:               2-3 days Anticipated DC barriers:         Patient requires inpatient management due to nausea, vomiting and inability to keep any food or fluid down  Consults called: None  Admission status: Inpatient    Frankey Shown MD [facility]  12/31/2020, 4:08 AM

## 2020-12-31 DIAGNOSIS — R1114 Bilious vomiting: Secondary | ICD-10-CM

## 2020-12-31 DIAGNOSIS — R748 Abnormal levels of other serum enzymes: Secondary | ICD-10-CM

## 2020-12-31 DIAGNOSIS — E860 Dehydration: Secondary | ICD-10-CM

## 2020-12-31 DIAGNOSIS — F12188 Cannabis abuse with other cannabis-induced disorder: Secondary | ICD-10-CM

## 2020-12-31 DIAGNOSIS — E871 Hypo-osmolality and hyponatremia: Secondary | ICD-10-CM

## 2020-12-31 DIAGNOSIS — R531 Weakness: Secondary | ICD-10-CM

## 2020-12-31 DIAGNOSIS — D72829 Elevated white blood cell count, unspecified: Secondary | ICD-10-CM

## 2020-12-31 DIAGNOSIS — R197 Diarrhea, unspecified: Secondary | ICD-10-CM

## 2020-12-31 LAB — RAPID URINE DRUG SCREEN, HOSP PERFORMED
Amphetamines: NOT DETECTED
Barbiturates: NOT DETECTED
Benzodiazepines: NOT DETECTED
Cocaine: NOT DETECTED
Opiates: NOT DETECTED
Tetrahydrocannabinol: POSITIVE — AB

## 2020-12-31 LAB — PROTIME-INR
INR: 1.1 (ref 0.8–1.2)
Prothrombin Time: 14.1 seconds (ref 11.4–15.2)

## 2020-12-31 LAB — HEPATITIS B SURFACE ANTIGEN: Hepatitis B Surface Ag: NONREACTIVE

## 2020-12-31 LAB — CBC
HCT: 39.1 % (ref 39.0–52.0)
Hemoglobin: 13.9 g/dL (ref 13.0–17.0)
MCH: 31.8 pg (ref 26.0–34.0)
MCHC: 35.5 g/dL (ref 30.0–36.0)
MCV: 89.5 fL (ref 80.0–100.0)
Platelets: 271 10*3/uL (ref 150–400)
RBC: 4.37 MIL/uL (ref 4.22–5.81)
RDW: 13.1 % (ref 11.5–15.5)
WBC: 11.6 10*3/uL — ABNORMAL HIGH (ref 4.0–10.5)
nRBC: 0 % (ref 0.0–0.2)

## 2020-12-31 LAB — COMPREHENSIVE METABOLIC PANEL
ALT: 29 U/L (ref 0–44)
AST: 47 U/L — ABNORMAL HIGH (ref 15–41)
Albumin: 4.4 g/dL (ref 3.5–5.0)
Alkaline Phosphatase: 52 U/L (ref 38–126)
Anion gap: 11 (ref 5–15)
BUN: 28 mg/dL — ABNORMAL HIGH (ref 6–20)
CO2: 23 mmol/L (ref 22–32)
Calcium: 9 mg/dL (ref 8.9–10.3)
Chloride: 101 mmol/L (ref 98–111)
Creatinine, Ser: 1.04 mg/dL (ref 0.61–1.24)
GFR, Estimated: >60 mL/min (ref >60)
Glucose, Bld: 101 mg/dL — ABNORMAL HIGH (ref 70–99)
Potassium: 3.4 mmol/L — ABNORMAL LOW (ref 3.5–5.1)
Sodium: 135 mmol/L (ref 135–145)
Total Bilirubin: 1.3 mg/dL — ABNORMAL HIGH (ref 0.3–1.2)
Total Protein: 7.8 g/dL (ref 6.5–8.1)

## 2020-12-31 LAB — HEPATITIS C ANTIBODY: HCV Ab: NONREACTIVE

## 2020-12-31 LAB — CK: Total CK: 557 U/L — ABNORMAL HIGH (ref 49–397)

## 2020-12-31 LAB — APTT: aPTT: 26 seconds (ref 24–36)

## 2020-12-31 LAB — HIV ANTIBODY (ROUTINE TESTING W REFLEX): HIV Screen 4th Generation wRfx: NONREACTIVE

## 2020-12-31 LAB — PHOSPHORUS: Phosphorus: 2.2 mg/dL — ABNORMAL LOW (ref 2.5–4.6)

## 2020-12-31 LAB — MAGNESIUM: Magnesium: 2.1 mg/dL (ref 1.7–2.4)

## 2020-12-31 MED ORDER — ONDANSETRON HCL 4 MG/2ML IJ SOLN
4.0000 mg | Freq: Four times a day (QID) | INTRAMUSCULAR | Status: DC | PRN
Start: 2020-12-31 — End: 2020-12-31
  Administered 2020-12-31: 4 mg via INTRAVENOUS
  Filled 2020-12-31: qty 2

## 2020-12-31 MED ORDER — PROCHLORPERAZINE EDISYLATE 10 MG/2ML IJ SOLN
10.0000 mg | Freq: Four times a day (QID) | INTRAMUSCULAR | Status: DC | PRN
  Administered 2020-12-31: 10 mg via INTRAVENOUS
  Filled 2020-12-31: qty 2

## 2020-12-31 MED ORDER — K PHOS MONO-SOD PHOS DI & MONO 155-852-130 MG PO TABS
500.0000 mg | ORAL_TABLET | Freq: Two times a day (BID) | ORAL | Status: DC
  Filled 2020-12-31: qty 2

## 2020-12-31 MED ORDER — ONDANSETRON HCL 4 MG/2ML IJ SOLN
4.0000 mg | Freq: Four times a day (QID) | INTRAMUSCULAR | Status: DC
Start: 2020-12-31 — End: 2021-01-01
  Administered 2020-12-31 (×2): 4 mg via INTRAVENOUS
  Filled 2020-12-31 (×2): qty 2

## 2020-12-31 MED ORDER — SODIUM CHLORIDE 0.9 % IV SOLN: Freq: Once | INTRAVENOUS | Status: AC

## 2020-12-31 MED ORDER — PANTOPRAZOLE SODIUM 40 MG IV SOLR
40.0000 mg | INTRAVENOUS | Status: DC
  Administered 2020-12-31: 40 mg via INTRAVENOUS
  Filled 2020-12-31: qty 40

## 2020-12-31 MED ORDER — METOCLOPRAMIDE HCL 5 MG/ML IJ SOLN
10.0000 mg | Freq: Four times a day (QID) | INTRAMUSCULAR | Status: DC | PRN
  Administered 2020-12-31: 10 mg via INTRAVENOUS
  Filled 2020-12-31: qty 2

## 2020-12-31 MED ORDER — POTASSIUM CHLORIDE IN NACL 20-0.9 MEQ/L-% IV SOLN
INTRAVENOUS | Status: DC
  Filled 2020-12-31: qty 1000

## 2020-12-31 MED ORDER — DIPHENHYDRAMINE HCL 50 MG/ML IJ SOLN
25.0000 mg | Freq: Four times a day (QID) | INTRAMUSCULAR | Status: DC | PRN
  Administered 2020-12-31: 25 mg via INTRAVENOUS
  Filled 2020-12-31: qty 1

## 2020-12-31 MED ORDER — PANTOPRAZOLE SODIUM 40 MG IV SOLR
40.0000 mg | Freq: Two times a day (BID) | INTRAVENOUS | Status: DC
  Administered 2020-12-31: 40 mg via INTRAVENOUS
  Filled 2020-12-31: qty 40

## 2020-12-31 NOTE — ED Notes (Signed)
Dr. Thomes Dinning into to speak with patient regarding his desire to leave.

## 2020-12-31 NOTE — ED Notes (Signed)
Checked on pt and asked pt if he needed anything.  

## 2020-12-31 NOTE — Progress Notes (Signed)
PROGRESS NOTE  Samuel [name] VZS:[id number] DOB: 1976/03/24 DOA: 12/30/2020 PCP: Iona Beard, MD  Brief History:  45 year old male with no documented chronic medical problems presenting with 3-day history of subjective fevers, chills, nausea, vomiting, and diarrhea.  The patient developed some associated myalgias.  He felt that this may have been due to heat exhaustion initially because he works as a Theme park manager.  Unfortunately, his symptoms persisted over the next 2 days.  He denies any headache, neck pain, chest pain, shortness of breath, coughing, hemoptysis, hematemesis, hematochezia, melena, dysuria, hematuria.  He denies any rashes or synovitis.  because of his generalized malaise, he essentially laid in bed for the next 2 to 3 days.  He states that he takes at least 1 Aleve on a daily basis.  In addition, the patient has been smoking marijuana at least 2 times per week.  He has been smoking marijuana for last 20 years.  He denies any illicit drug use. In the emergency department, the patient had low-grade temperature of 99.6 F.  He was hemodynamically stable with oxygen saturation 100% on room air.  BMP shows sodium 133, potassium 3.6, serum creatinine 1.04.  AST 55, ALT 29, alk phosphatase 52, total bilirubin 1.3.  WBC 12.4, hemoglobin 14.0, platelets 294,000.  UA was negative for pyuria.  EKG shows sinus rhythm without any STT wave changes.  CT of the abdomen pelvis was negative for any acute abnormalities.  CPK was 818.  The patient was admitted for further evaluation and treatment of his persistent nausea and vomiting.  Assessment/Plan: Cannabis hyperemesis syndrome -This is likely the primary driving factor of his intractable nausea and vomiting -Certainly, the patient may have superimposed gastroenteritis -Start Zofran around-the-clock -Continue IV fluids -Continue Protonix -Urine drug screen -Clear liquid diet for now  Diarrhea -Stool for C. Difficile -Stool pathogen  panel  Elevated CPK -Continue IV fluids  Hyponatremia -Secondary to volume depletion -Improving with IV fluids  Hypophosphatemia -Repleted  Transaminasemia -May be in part due to the patient's elevated CPK -Check viral hepatitis panel  Hypokalemia/hypophosphatemia -replete    Status is: Inpatient  Remains inpatient appropriate because:Inpatient level of care appropriate due to severity of illness  Dispo: The patient is from: Home              Anticipated d/c is to: Home              Patient currently is not medically stable to d/c.   Difficult to place patient No        Family Communication:    no Family at bedside  Consultants:  none  Code Status:  FULL   DVT Prophylaxis:  Ulen Lovenox   Procedures: As Listed in Progress Note Above  Antibiotics: None        Subjective: Patient states that his vomiting is little better.  He still feels nauseous.  He has some epigastric discomfort.  He denies any fevers, chills, chest pain, short of breath, coughing, hemoptysis, dysuria, hematuria.  There is no hematochezia or melena.  He still has some myalgias and generalized weakness.  Objective: Vitals:   12/31/20 0600 12/31/20 0615 12/31/20 0630 12/31/20 0645  BP:      Pulse: 65 73 75 69  Resp: '15 19 13   ' Temp:      TempSrc:      SpO2: 100% 100% 100% 98%  Weight:      Height:  Intake/Output Summary (Last 24 hours) at 12/31/2020 0829 Last data filed at 12/30/2020 2312 Gross per 24 hour  Intake --  Output 200 ml  Net -200 ml   Weight change:  Exam:  General:  Pt is alert, follows commands appropriately, not in acute distress HEENT: No icterus, No thrush, No neck mass, [place]/AT Cardiovascular: RRR, S1/S2, no rubs, no gallops Respiratory: Diminished breath sounds at the bases.  Bibasilar rales.  No wheezing. Abdomen: Soft/+BS, non tender, non distended, no guarding Extremities: No edema, No lymphangitis, No petechiae, No rashes, no  synovitis   Data Reviewed: I have personally reviewed following labs and imaging studies Basic Metabolic Panel: Recent Labs  Lab 12/30/20 1501 12/31/20 0457  NA 132* 135  K 3.6 3.4*  CL 97* 101  CO2 24 23  GLUCOSE 123* 101*  BUN 30* 28*  CREATININE 1.04 1.04  CALCIUM 9.0 9.0  MG  --  2.1  PHOS  --  2.2*   Liver Function Tests: Recent Labs  Lab 12/30/20 1501 12/31/20 0457  AST 55* 47*  ALT 29 29  ALKPHOS 54 52  BILITOT 0.9 1.3*  PROT 8.5* 7.8  ALBUMIN 4.9 4.4   Recent Labs  Lab 12/30/20 1501  LIPASE 29   No results for input(s): AMMONIA in the last 168 hours. Coagulation Profile: Recent Labs  Lab 12/31/20 0457  INR 1.1   CBC: Recent Labs  Lab 12/30/20 1501 12/31/20 0457  WBC 12.4* 11.6*  HGB 14.0 13.9  HCT 39.9 39.1  MCV 89.3 89.5  PLT 294 271   Cardiac Enzymes: Recent Labs  Lab 12/30/20 1501  CKTOTAL 818*   BNP: Invalid input(s): POCBNP CBG: No results for input(s): GLUCAP in the last 168 hours. HbA1C: No results for input(s): HGBA1C in the last 72 hours. Urine analysis:    Component Value Date/Time   COLORURINE YELLOW 12/30/2020 2146   APPEARANCEUR CLEAR 12/30/2020 2146   LABSPEC 1.025 12/30/2020 2146   PHURINE 6.0 12/30/2020 2146   GLUCOSEU NEGATIVE 12/30/2020 2146   HGBUR TRACE (A) 12/30/2020 2146   BILIRUBINUR NEGATIVE 12/30/2020 2146   KETONESUR 40 (A) 12/30/2020 2146   PROTEINUR 30 (A) 12/30/2020 2146   UROBILINOGEN 0.2 11/04/2007 1443   NITRITE NEGATIVE 12/30/2020 2146   LEUKOCYTESUR NEGATIVE 12/30/2020 2146   Sepsis Labs: '@LABRCNTIP' (procalcitonin:4,lacticidven:4) ) Recent Results (from the past 240 hour(s))  Resp Panel by RT-PCR (Flu A&B, Covid) Nasopharyngeal Swab     Status: None   Collection Time: 12/30/20  4:50 PM   Specimen: Nasopharyngeal Swab; Nasopharyngeal(NP) swabs in vial transport medium  Result Value Ref Range Status   SARS Coronavirus 2 by RT PCR NEGATIVE NEGATIVE Final    Comment: (NOTE) SARS-CoV-2  target nucleic acids are NOT DETECTED.  The SARS-CoV-2 RNA is generally detectable in upper respiratory specimens during the acute phase of infection. The lowest concentration of SARS-CoV-2 viral copies this assay can detect is 138 copies/mL. A negative result does not preclude SARS-Cov-2 infection and should not be used as the sole basis for treatment or other patient management decisions. A negative result may occur with  improper specimen collection/handling, submission of specimen other than nasopharyngeal swab, presence of viral mutation(s) within the areas targeted by this assay, and inadequate number of viral copies(<138 copies/mL). A negative result must be combined with clinical observations, patient history, and epidemiological information. The expected result is Negative.  Fact Sheet for Patients:  EntrepreneurPulse.com.au  Fact Sheet for Healthcare Providers:  IncredibleEmployment.be  This test is no t yet  approved or cleared by the [place] and  has been authorized for detection and/or diagnosis of SARS-CoV-2 by FDA under an Emergency Use Authorization (EUA). This EUA will remain  in effect (meaning this test can be used) for the duration of the COVID-19 declaration under Section 564(b)(1) of the Act, 21 U.S.C.section 360bbb-3(b)(1), unless the authorization is terminated  or revoked sooner.       Influenza A by PCR NEGATIVE NEGATIVE Final   Influenza B by PCR NEGATIVE NEGATIVE Final    Comment: (NOTE) The Xpert Xpress SARS-CoV-2/FLU/RSV plus assay is intended as an aid in the diagnosis of influenza from Nasopharyngeal swab specimens and should not be used as a sole basis for treatment. Nasal washings and aspirates are unacceptable for Xpert Xpress SARS-CoV-2/FLU/RSV testing.  Fact Sheet for Patients: EntrepreneurPulse.com.au  Fact Sheet for Healthcare  Providers: IncredibleEmployment.be  This test is not yet approved or cleared by the [place] FDA and has been authorized for detection and/or diagnosis of SARS-CoV-2 by FDA under an Emergency Use Authorization (EUA). This EUA will remain in effect (meaning this test can be used) for the duration of the COVID-19 declaration under Section 564(b)(1) of the Act, 21 U.S.C. section 360bbb-3(b)(1), unless the authorization is terminated or revoked.  Performed at [facility], [address], [place],  [address]      Scheduled Meds:  enoxaparin (LOVENOX) injection  40 mg Subcutaneous Q24H   pantoprazole (PROTONIX) IV  40 mg Intravenous Q24H   Continuous Infusions:  Procedures/Studies: CT ABDOMEN PELVIS W CONTRAST  Result Date: 12/30/2020 CLINICAL DATA:  Diarrhea, fever, nausea and vomiting for 3 days EXAM: CT ABDOMEN AND PELVIS WITH CONTRAST TECHNIQUE: Multidetector CT imaging of the abdomen and pelvis was performed using the standard protocol following bolus administration of intravenous contrast. CONTRAST:  64m OMNIPAQUE IOHEXOL 350 MG/ML SOLN COMPARISON:  08/14/2003 FINDINGS: Lower chest: No acute pleural or parenchymal lung disease. Hepatobiliary: No focal liver abnormality is seen. No gallstones, gallbladder wall thickening, or biliary dilatation. Pancreas: Unremarkable. No pancreatic ductal dilatation or surrounding inflammatory changes. Spleen: Normal in size without focal abnormality. Adrenals/Urinary Tract: Adrenal glands are unremarkable. Kidneys are normal, without renal calculi, focal lesion, or hydronephrosis. Bladder is unremarkable. Stomach/Bowel: No bowel obstruction or ileus. Normal appendix right lower quadrant. No bowel wall thickening or inflammatory change. Vascular/Lymphatic: No significant vascular findings are present. No enlarged abdominal or pelvic lymph nodes. Reproductive: Prostate is unremarkable. Other: No free fluid or free gas.  No  abdominal wall hernia. Musculoskeletal: No acute or destructive bony lesions. Reconstructed images demonstrate no additional findings. IMPRESSION: 1. No acute intra-abdominal or intrapelvic process. Electronically Signed   By: MRanda NgoM.D.   On: 12/30/2020 22:11    DOrson Eva DO  [facility]  If 7PM-7AM, please contact night-coverage [email or web address] Password [email or web address] 12/31/2020, 8:29 AM   LOS: 1 day

## 2020-12-31 NOTE — ED Notes (Signed)
Amber s/o leaving bedside at this time. S/O can be reached at [phone]

## 2021-01-28 NOTE — Discharge Summary (Signed)
Physician Discharge Summary  Samuel [name] DXA:[id number] DOB: 11-Oct-1975 DOA: 12/30/2020  PCP: Mirna Mires, MD  Admit date: 12/30/2020 Discharge date: 12/31/2020  Admitted From: Home Disposition:  AGAINST MEDICAL ADVICE      Discharge Condition: AGAINST MEDICAL ADVICE    Brief/Interim Summary: 45 y.o. male with no past medical history who presents to the emergency department accompanied by fiance due to 3-day onset of generalized weakness, fever, diarrhea.  Patient states that he was working as a Designer, fashion/clothing about 3 days ago and that it was a hot day, on his way home he started to feel fatigued and by the time he got home, he was nauseous and was vomiting, he states that it may be due to heat exhaustion and he complained of subjective fever, chills and muscle pain.  Vomiting was bilious in nature and was without blood, he complained of not being able to hold any food or fluid down, diarrhea was several episodes daily with last episode being yesterday.  Patient states that he took home COVID test yesterday and it was negative.  He endorsed abdominal soreness from recurrent vomiting, but denies chest pain, shortness of breath. Patient states that he had similar episode about 7 years ago   ED Course:  In the emergency department, he was hemodynamically stable.  Work-up in the ED showed leukocytosis, mild hyponatremia, CK8 180, urinalysis was unimpressive for UTI.  Influenza a, B, SARS coronavirus 2 was negative. CT abdomen and pelvis with contrast showed no acute intra-abdominal or intrapelvic process He was treated with Toradol, Pepcid, Reglan, morphine, Zofran and IV hydration was provided.  Hospitalist was asked to admit patient for further evaluation and management.. The patient was continued on IVF and antiemetics.  On the evening of 12/31/20, the patient wanted to leave AMA despite continued n/v.  Dr. Thomes Dinning went to speak with the patient and the patient continued to want to leave  AMA.  Discharge Diagnoses:  Cannabis hyperemesis syndrome -This is likely the primary driving factor of his intractable nausea and vomiting -Certainly, the patient may have superimposed gastroenteritis -Start Zofran around-the-clock -Continue IV fluids -Continue Protonix -Urine drug screen -Clear liquid diet started -patient left hospital AMA after speaking with Dr. Thomes Dinning   Diarrhea -Stool for C. Difficile--not collected as patient left AMA -Stool pathogen panel   Elevated CPK -Continue IV fluids   Hyponatremia -Secondary to volume depletion -Improving with IV fluids   Hypophosphatemia -Repleted   Transaminasemia -May be in part due to the patient's elevated CPK -Check viral hepatitis panel--neg hep B,C   Hypokalemia/hypophosphatemia -replete   Discharge Instructions   Allergies as of 12/31/2020       Reactions   Bee Venom Swelling   Penicillins Swelling   Phenergan [promethazine Hcl] Other (See Comments)   "burning when they give it to me."        Medication List     ASK your doctor about these medications    DSS 100 MG Caps Take by mouth.   EPINEPHrine 0.3 mg/0.3 mL Soaj injection Commonly known as: EPI-PEN Inject into the muscle.   HYDROcodone-acetaminophen 5-325 MG tablet Commonly known as: NORCO/VICODIN Take 1 tablet by mouth every 4 (four) hours as needed.   ibuprofen 200 MG tablet Commonly known as: ADVIL Take 200 mg by mouth every 6 (six) hours as needed.        Allergies  Allergen Reactions   Bee Venom Swelling   Penicillins Swelling   Phenergan [Promethazine Hcl] Other (See Comments)    "  burning when they give it to me."    Consultations: none   Procedures/Studies: CT ABDOMEN PELVIS W CONTRAST  Result Date: 12/30/2020 CLINICAL DATA:  Diarrhea, fever, nausea and vomiting for 3 days EXAM: CT ABDOMEN AND PELVIS WITH CONTRAST TECHNIQUE: Multidetector CT imaging of the abdomen and pelvis was performed using the standard  protocol following bolus administration of intravenous contrast. CONTRAST:  52mL OMNIPAQUE IOHEXOL 350 MG/ML SOLN COMPARISON:  08/14/2003 FINDINGS: Lower chest: No acute pleural or parenchymal lung disease. Hepatobiliary: No focal liver abnormality is seen. No gallstones, gallbladder wall thickening, or biliary dilatation. Pancreas: Unremarkable. No pancreatic ductal dilatation or surrounding inflammatory changes. Spleen: Normal in size without focal abnormality. Adrenals/Urinary Tract: Adrenal glands are unremarkable. Kidneys are normal, without renal calculi, focal lesion, or hydronephrosis. Bladder is unremarkable. Stomach/Bowel: No bowel obstruction or ileus. Normal appendix right lower quadrant. No bowel wall thickening or inflammatory change. Vascular/Lymphatic: No significant vascular findings are present. No enlarged abdominal or pelvic lymph nodes. Reproductive: Prostate is unremarkable. Other: No free fluid or free gas.  No abdominal wall hernia. Musculoskeletal: No acute or destructive bony lesions. Reconstructed images demonstrate no additional findings. IMPRESSION: 1. No acute intra-abdominal or intrapelvic process. Electronically Signed   By: Sharlet Salina M.D.   On: 12/30/2020 22:11        Discharge Exam: Vitals:   12/31/20 1608 12/31/20 1930  BP: 130/88 (!) 130/99  Pulse: 70 88  Resp: 18 20  Temp:  98.3 F (36.8 C)  SpO2: 100% 100%   Vitals:   12/31/20 1228 12/31/20 1430 12/31/20 1608 12/31/20 1930  BP: 122/80  130/88 (!) 130/99  Pulse: 78 96 70 88  Resp: 19 12 18 20   Temp: 98 F (36.7 C)   98.3 F (36.8 C)  TempSrc: Oral   Oral  SpO2: 100% 98% 100% 100%  Weight:      Height:          The results of significant diagnostics from this hospitalization (including imaging, microbiology, ancillary and laboratory) are listed below for reference.    Significant Diagnostic Studies: CT ABDOMEN PELVIS W CONTRAST  Result Date: 12/30/2020 CLINICAL DATA:  Diarrhea, fever,  nausea and vomiting for 3 days EXAM: CT ABDOMEN AND PELVIS WITH CONTRAST TECHNIQUE: Multidetector CT imaging of the abdomen and pelvis was performed using the standard protocol following bolus administration of intravenous contrast. CONTRAST:  3mL OMNIPAQUE IOHEXOL 350 MG/ML SOLN COMPARISON:  08/14/2003 FINDINGS: Lower chest: No acute pleural or parenchymal lung disease. Hepatobiliary: No focal liver abnormality is seen. No gallstones, gallbladder wall thickening, or biliary dilatation. Pancreas: Unremarkable. No pancreatic ductal dilatation or surrounding inflammatory changes. Spleen: Normal in size without focal abnormality. Adrenals/Urinary Tract: Adrenal glands are unremarkable. Kidneys are normal, without renal calculi, focal lesion, or hydronephrosis. Bladder is unremarkable. Stomach/Bowel: No bowel obstruction or ileus. Normal appendix right lower quadrant. No bowel wall thickening or inflammatory change. Vascular/Lymphatic: No significant vascular findings are present. No enlarged abdominal or pelvic lymph nodes. Reproductive: Prostate is unremarkable. Other: No free fluid or free gas.  No abdominal wall hernia. Musculoskeletal: No acute or destructive bony lesions. Reconstructed images demonstrate no additional findings. IMPRESSION: 1. No acute intra-abdominal or intrapelvic process. Electronically Signed   By: 08/16/2003 M.D.   On: 12/30/2020 22:11    Microbiology: No results found for this or any previous visit (from the past 240 hour(s)).   Labs: Basic Metabolic Panel: No results for input(s): NA, K, CL, CO2, GLUCOSE, BUN, CREATININE, CALCIUM, MG, PHOS in  the last 168 hours. Liver Function Tests: No results for input(s): AST, ALT, ALKPHOS, BILITOT, PROT, ALBUMIN in the last 168 hours. No results for input(s): LIPASE, AMYLASE in the last 168 hours. No results for input(s): AMMONIA in the last 168 hours. CBC: No results for input(s): WBC, NEUTROABS, HGB, HCT, MCV, PLT in the last 168  hours. Cardiac Enzymes: No results for input(s): CKTOTAL, CKMB, CKMBINDEX, TROPONINI in the last 168 hours. BNP: Invalid input(s): POCBNP CBG: No results for input(s): GLUCAP in the last 168 hours.  Time coordinating discharge:  36 minutes  Signed:  Catarina Hartshorn, DO [facility] Pager: [phone] 01/28/2021, 2:19 PM

## 2021-09-08 ENCOUNTER — Encounter (HOSPITAL_COMMUNITY): Payer: Self-pay | Admitting: *Deleted

## 2021-09-08 ENCOUNTER — Emergency Department (HOSPITAL_COMMUNITY): Payer: Self-pay

## 2021-09-08 ENCOUNTER — Other Ambulatory Visit: Payer: Self-pay

## 2021-09-08 ENCOUNTER — Emergency Department (HOSPITAL_COMMUNITY)
Admission: EM | Admit: 2021-09-08 | Discharge: 2021-09-08 | Disposition: A | Discharge status: Home / Self Care | Payer: Self-pay | Attending: Emergency Medicine | Admitting: Emergency Medicine

## 2021-09-08 DIAGNOSIS — S62514A Nondisplaced fracture of proximal phalanx of right thumb, initial encounter for closed fracture: Secondary | ICD-10-CM | POA: Insufficient documentation

## 2021-09-08 DIAGNOSIS — S62521A Displaced fracture of distal phalanx of right thumb, initial encounter for closed fracture: Secondary | ICD-10-CM | POA: Insufficient documentation

## 2021-09-08 DIAGNOSIS — S81011A Laceration without foreign body, right knee, initial encounter: Secondary | ICD-10-CM | POA: Insufficient documentation

## 2021-09-08 DIAGNOSIS — W19XXXA Unspecified fall, initial encounter: Secondary | ICD-10-CM

## 2021-09-08 DIAGNOSIS — M25561 Pain in right knee: Secondary | ICD-10-CM | POA: Insufficient documentation

## 2021-09-08 DIAGNOSIS — W132XXA Fall from, out of or through roof, initial encounter: Secondary | ICD-10-CM | POA: Insufficient documentation

## 2021-09-08 MED ORDER — ONDANSETRON HCL 4 MG/2ML IJ SOLN: 4.0000 mg | Freq: Once | INTRAMUSCULAR | Status: DC

## 2021-09-08 MED ORDER — ONDANSETRON 4 MG PO TBDP
4.0000 mg | ORAL_TABLET | Freq: Once | ORAL | Status: AC
  Administered 2021-09-08: 4 mg via ORAL
  Filled 2021-09-08: qty 1

## 2021-09-08 MED ORDER — OXYCODONE-ACETAMINOPHEN 5-325 MG PO TABS
1.0000 | ORAL_TABLET | Freq: Once | ORAL | Status: AC
  Administered 2021-09-08: 1 via ORAL
  Filled 2021-09-08: qty 1

## 2021-09-08 MED ORDER — HYDROMORPHONE HCL 1 MG/ML IJ SOLN
1.0000 mg | Freq: Once | INTRAMUSCULAR | Status: DC
Start: 2021-09-08 — End: 2021-09-08

## 2021-09-08 MED ORDER — SULFAMETHOXAZOLE-TRIMETHOPRIM 800-160 MG PO TABS: 1.0000 | ORAL_TABLET | Freq: Two times a day (BID) | ORAL | 0 refills | Status: AC

## 2021-09-08 MED ORDER — OXYCODONE-ACETAMINOPHEN 5-325 MG PO TABS: 2.0000 | ORAL_TABLET | Freq: Four times a day (QID) | ORAL | 0 refills | Status: AC | PRN

## 2021-09-08 MED ORDER — HYDROMORPHONE HCL 1 MG/ML IJ SOLN
1.0000 mg | Freq: Once | INTRAMUSCULAR | Status: AC
  Administered 2021-09-08: 1 mg via INTRAMUSCULAR
  Filled 2021-09-08: qty 1

## 2021-09-08 NOTE — ED Provider Notes (Signed)
[facility] EMERGENCY DEPARTMENT Provider Note   CSN: [id number] Arrival date & time: 09/08/21  1527     History  Chief Complaint  Patient presents with   Wrist Pain    Samuel [name] is a 46 y.o. male with past medical history of tonsillectomy and adenoidectomy.  Presents to the emergency department with a chief complaint of right wrist, right hand, and right knee pain after suffering a fall.  Patient reports that today at approximately 10 AM he fell off a roof while at work.  Fall was approximately 14 feet.  Patient endorses landing onto a pile of wood.  Patient denies any head injury or loss of consciousness.  Patient states that he was holding a nail gun while he fell and during the fall the nail gun twisted in his hand causing his thumb to be forced backward.  Patient states that when he looked at his thumb it was "turned incorrectly."  Patient reports that he has not had any range of motion to his right thumb since this fall.  Patient states that he does have numbness to right MCP joint.  Patient rates his pain 10/10 on the pain scale.  Pain is worse with touch and movement.  Patient took Aleve at approximately 11 AM with no improvement in his pain.  Patient states that he was able to finish his that work and complete payroll prior to coming to the emergency department.  Patient has been ambulatory since the fall.  Reports last tetanus shot 1.5 years prior.  Patient denies any blood thinner use, neck pain, back pain, numbness bilateral legs, weakness, saddle anesthesia, urinary retention, chest pain, shortness of breath, abdominal pain, nausea, vomiting, hematuria.     Wrist Pain Pertinent negatives include no chest pain, no abdominal pain, no headaches and no shortness of breath.      Home Medications Prior to Admission medications   Medication Sig Start Date End Date Taking? Authorizing Provider  EPINEPHrine 0.3 mg/0.3 mL IJ SOAJ injection Inject into the muscle. 10/07/19    [provider]  HYDROcodone-acetaminophen (NORCO/VICODIN) 5-325 MG tablet Take 1 tablet by mouth every 4 (four) hours as needed. Patient not taking: No sig reported 11/26/17   Burgess Amor, PA-C  ibuprofen (ADVIL,MOTRIN) 200 MG tablet Take 200 mg by mouth every 6 (six) hours as needed. Patient not taking: No sig reported    [provider]      Allergies    Bee venom, Penicillins, and Phenergan [promethazine hcl]    Review of Systems   Review of Systems  Constitutional:  Negative for chills and fever.  HENT:  Negative for facial swelling.   Eyes:  Negative for visual disturbance.  Respiratory:  Negative for shortness of breath.   Cardiovascular:  Negative for chest pain.  Gastrointestinal:  Negative for abdominal pain, nausea and vomiting.  Genitourinary:  Negative for difficulty urinating, dysuria and hematuria.  Musculoskeletal:  Positive for arthralgias and myalgias. Negative for back pain and neck pain.  Skin:  Negative for color change and rash.  Neurological:  Positive for numbness. Negative for dizziness, syncope, weakness, light-headedness and headaches.  Psychiatric/Behavioral:  Negative for confusion.    Physical Exam Updated Vital Signs BP (!) 119/106 (BP Location: Left Arm) Comment: pt moving back and forth in triage chair  Pulse (!) 120   Temp 97.8 F (36.6 C) (Oral)   Resp 18   Ht 6\' 1"  (1.854 m)   SpO2 98%   BMI 20.45 kg/m  Physical Exam Vitals and nursing note reviewed.  Constitutional:      General: He is not in acute distress.    Appearance: He is not ill-appearing, toxic-appearing or diaphoretic.  HENT:     Head: Normocephalic and atraumatic. No raccoon eyes, Battle's sign, abrasion, contusion or laceration.     Jaw: No trismus, tenderness, swelling, pain on movement or malocclusion.  Eyes:     General: No scleral icterus.       Right eye: No discharge.        Left eye: No discharge.  Cardiovascular:     Rate and Rhythm: Normal rate.   Pulmonary:     Effort: Pulmonary effort is normal.  Chest:     Chest wall: No mass, lacerations, deformity, swelling, tenderness, crepitus or edema.     Comments: No ecchymosis to chest wall Abdominal:     General: Abdomen is flat. There is no distension. There are no signs of injury.     Palpations: Abdomen is soft. There is no mass or pulsatile mass.     Tenderness: There is no abdominal tenderness. There is no guarding or rebound.     Hernia: There is no hernia in the umbilical area or ventral area.     Comments: No ecchymosis to abdomen  Musculoskeletal:     Right shoulder: No swelling, deformity, effusion, laceration, tenderness, bony tenderness or crepitus.     Left shoulder: No swelling, deformity, effusion, laceration, tenderness, bony tenderness or crepitus.     Right upper arm: Normal.     Left upper arm: Normal.     Right elbow: Normal.     Left elbow: Normal.     Right forearm: Normal.     Left forearm: Normal.     Right wrist: Tenderness, bony tenderness and snuff box tenderness present. No swelling, deformity, effusion, lacerations or crepitus. Normal range of motion. Normal pulse.     Left wrist: Normal.     Right hand: Tenderness and bony tenderness present. No swelling, deformity or lacerations. Decreased range of motion. Decreased strength. Normal sensation. Normal capillary refill.     Left hand: No swelling, deformity, lacerations, tenderness or bony tenderness. Normal range of motion. Normal strength. Normal sensation. Normal capillary refill.     Cervical back: No swelling, edema, deformity, erythema, signs of trauma, lacerations, rigidity, spasms, torticollis, tenderness, bony tenderness or crepitus. No pain with movement. Normal range of motion.     Thoracic back: No swelling, edema, deformity, signs of trauma, lacerations, spasms, tenderness or bony tenderness.     Lumbar back: No swelling, edema, deformity, signs of trauma, lacerations, spasms, tenderness or bony  tenderness.     Right upper leg: Normal.     Left upper leg: Normal.     Right knee: Swelling and laceration present. No deformity, effusion, erythema, ecchymosis or bony tenderness. Decreased range of motion. Tenderness present over the medial joint line.     Left knee: No swelling, deformity, effusion, erythema, ecchymosis, lacerations, bony tenderness or crepitus. Normal range of motion. No tenderness. Normal alignment.     Right lower leg: Normal.     Left lower leg: Normal.     Right ankle: No swelling, deformity, ecchymosis or lacerations. No tenderness. Normal range of motion.     Left ankle: No swelling, deformity, ecchymosis or lacerations. No tenderness. Normal range of motion.     Right foot: Normal range of motion and normal capillary refill. No swelling, deformity, laceration, tenderness, bony tenderness or crepitus.  Normal pulse.     Left foot: Normal range of motion and normal capillary refill. No swelling, deformity, laceration, tenderness, bony tenderness or crepitus. Normal pulse.     Comments: Puncture wounds above and below right knee, all hemorrhage controlled.  Minimal swelling to medial aspect of left knee.  Tenderness to medial joint line.  Decreased flexion secondary to complaints of pain.  Pelvis stable, no leg length discrepancy or rotation.  Swelling to right thumb and dorsum of right hand.  Patient unable to ambulate right thumb in any direction.  Right thumb is exquisitely tender to palpation.  Tenderness to anatomical snuffbox.  Sensation intact to all digits of right hand.  Cap refill less than 2 seconds in all digits of right hand.  Skin:    General: Skin is warm and dry.  Neurological:     General: No focal deficit present.     Mental Status: He is alert and oriented to person, place, and time.     GCS: GCS eye subscore is 4. GCS verbal subscore is 5. GCS motor subscore is 6.     Cranial Nerves: Cranial nerves 2-12 are intact. No cranial nerve deficit,  dysarthria or facial asymmetry.  Psychiatric:        Behavior: Behavior is cooperative.    ED Results / Procedures / Treatments   Labs (all labs ordered are listed, but only abnormal results are displayed) Labs Reviewed - No data to display  EKG None  Radiology DG Wrist Complete Right  Result Date: 09/08/2021 CLINICAL DATA:  Right hand and wrist pain after falling off a roof. EXAM: RIGHT WRIST - COMPLETE 3+ VIEW; RIGHT HAND - COMPLETE 3+ VIEW COMPARISON:  Right hand x-rays dated Aug 22, 2015. FINDINGS: Acute comminuted intra-articular fracture at the base of the first distal phalanx with mild dorsal angulation. Small acute avulsion fracture at the ulnar base of the first proximal phalanx. No additional fracture. No dislocation. Joint spaces are preserved. Bone mineralization is normal. Soft tissue swelling of the thumb. IMPRESSION: 1. Acute fractures of the first distal and proximal phalanges as described above. 2. No wrist fracture. Electronically Signed   By: Obie Dredge M.D.   On: 09/08/2021 16:34   DG Knee Complete 4 Views Right  Result Date: 09/08/2021 CLINICAL DATA:  Right knee pain after falling off a roof. EXAM: RIGHT KNEE - COMPLETE 4+ VIEW COMPARISON:  None Available. FINDINGS: No acute fracture or dislocation. No joint effusion. Tiny tricompartmental marginal osteophytes. Joint spaces are preserved. Bone mineralization is normal. Soft tissues are unremarkable. IMPRESSION: 1. No acute osseous abnormality. Electronically Signed   By: Obie Dredge M.D.   On: 09/08/2021 16:35   DG Hand Complete Right  Result Date: 09/08/2021 CLINICAL DATA:  Right hand and wrist pain after falling off a roof. EXAM: RIGHT WRIST - COMPLETE 3+ VIEW; RIGHT HAND - COMPLETE 3+ VIEW COMPARISON:  Right hand x-rays dated Aug 22, 2015. FINDINGS: Acute comminuted intra-articular fracture at the base of the first distal phalanx with mild dorsal angulation. Small acute avulsion fracture at the ulnar base of the  first proximal phalanx. No additional fracture. No dislocation. Joint spaces are preserved. Bone mineralization is normal. Soft tissue swelling of the thumb. IMPRESSION: 1. Acute fractures of the first distal and proximal phalanges as described above. 2. No wrist fracture. Electronically Signed   By: Obie Dredge M.D.   On: 09/08/2021 16:34    Procedures Procedures    Medications Ordered in ED Medications  HYDROmorphone (  DILAUDID) injection 1 mg (1 mg Intramuscular Given 09/08/21 1651)  ondansetron (ZOFRAN-ODT) disintegrating tablet 4 mg (4 mg Oral Given 09/08/21 1651)  oxyCODONE-acetaminophen (PERCOCET/ROXICET) 5-325 MG per tablet 1 tablet (1 tablet Oral Given 09/08/21 1822)    ED Course/ Medical Decision Making/ A&P                           Medical Decision Making Risk Prescription drug management.   Alert 46 year old male in no acute distress, nontoxic-appearing.  Appears uncomfortable due to complaints of pain.  Presents to the emergency department complaint of right hand, right wrist, and right knee pain after suffering a fall.  Information obtained from patient.  Past medical records were reviewed including previous provider notes.    Per chart review patient has history of right hand fracture, previously seen by Dr. Hilda LiasKeeling with [facility].  Due to patient's reports of injuries will obtain x-rays of right hand, right wrist, and right knee.  CT imaging of head, neck, chest, abdomen, and spine were considered due to patient's mechanism of injury however patient denies hitting his head or any loss of consciousness, is not on any blood thinners, has no obvious injuries to indicate further imaging at this time.  Patient given Dilaudid and Zofran for pain management.  I personally viewed interpret patient's x-ray imaging.  Agree with radiology interpretation of no acute osseous abnormality of right knee or right wrist.  Acute comminuted intra-articular  fracture at the base of the first distal phalanx with mild dorsal angulation.  Small acute avulsion fracture at the ulnar base of the first proximal phalanx.  Due to patient's decreased range of motion and fracture we will consult on-call hand specialist.  I spoke to Dr. Janee Mornhompson who advised to place the patient in a thumb spica splint.  His office will call to schedule a follow-up appointment on Monday.  Due to patient's decreased range of motion to right knee we will place him in knee immobilizer and have him follow-up with orthopedic provider in outpatient setting.  Patient hemodynamically stable at this time.  Will discharge patient with short course of pain medication as well as Bactrim for puncture wound to right leg.  Based on patient's chief complaint, I considered admission might be necessary, however after reassuring ED workup feel patient is reasonable for discharge.  Discussed results, findings, treatment and follow up. Patient advised of return precautions. Patient verbalized understanding and agreed with plan.  Portions of this note were generated with Scientist, clinical (histocompatibility and immunogenetics)Dragon dictation software. Dictation errors may occur despite best attempts at proofreading.         Final Clinical Impression(s) / ED Diagnoses Final diagnoses:  Fall, initial encounter  Closed displaced fracture of distal phalanx of right thumb, initial encounter  Closed nondisplaced fracture of proximal phalanx of right thumb, initial encounter  Acute pain of right knee    Rx / DC Orders ED Discharge Orders          Ordered    sulfamethoxazole-trimethoprim (BACTRIM DS) 800-160 MG tablet  2 times daily        09/08/21 1754    oxyCODONE-acetaminophen (PERCOCET/ROXICET) 5-325 MG tablet  Every 6 hours PRN        09/08/21 1758              Haskel SchroederBadalamente, Judeth Gilles R, PA-C 09/08/21 1926    Vanetta MuldersZackowski, Scott, MD 09/14/21 [phone]

## 2021-09-08 NOTE — Discharge Instructions (Signed)
You came to the emergency department today to be evaluated for your injuries after suffering a fall.  The x-ray of your right hand showed you have multiple fractures to your thumb.  The hand specialist Dr. Grandville Silos will contact you on Monday.  If you do not hear from their office please use the information on this paperwork to call and schedule follow-up appointment.  I have given you information to follow-up with a another orthopedic specialist to follow-up about your knee.  Please call their office on Monday to schedule a follow-up appointment.  Today you received medications that may make you sleepy or impair your ability to make decisions.  For the next 24 hours please do not drive, operate heavy machinery, care for a small child with out another adult present, or perform any activities that may cause harm to you or someone else if you were to fall asleep or be impaired.   You are being prescribed a medication which may make you sleepy or mpair your ability to make decisions. Please follow up of listed precautions for at least 24 hours after taking one dose.  Get help right away if: You have: Loss of feeling (numbness), tingling, or weakness in your arms or legs. Very bad neck pain, especially tenderness in the middle of the back of your neck. A change in your ability to control your pee or poop (stool). More pain in any area of your body. Swelling in any area of your body, especially your legs. Shortness of breath or light-headedness. Chest pain. Blood in your pee, poop, or vomit. Very bad pain in your belly (abdomen) or your back. Very bad headaches or headaches that are getting worse. Sudden vision loss or double vision. Your eye suddenly turns red. The black center of your eye (pupil) is an odd shape or size.

## 2021-09-08 NOTE — ED Triage Notes (Addendum)
Pt in from home reports fell off roof (14 ft) landing on to some wood, denies hitting head & denies LOC, pt reports landing on his Fl should with R hand still in the nail gun which turned his wrist, pt c/o R wrist pain, pt reports his thumb was turned incorrectly and he turned it back, no obvious wrist deformity, no swelling noted at this time, skin intact, +radial pulses, ambulatory, A&O x4

## 2023-01-04 ENCOUNTER — Encounter (HOSPITAL_COMMUNITY): Payer: Self-pay

## 2023-01-04 ENCOUNTER — Emergency Department (HOSPITAL_COMMUNITY)
Admission: EM | Admit: 2023-01-04 | Discharge: 2023-01-04 | Disposition: A | Discharge status: Home / Self Care | Payer: Self-pay | Attending: Emergency Medicine | Admitting: Emergency Medicine

## 2023-01-04 ENCOUNTER — Other Ambulatory Visit: Payer: Self-pay

## 2023-01-04 DIAGNOSIS — T7840XA Allergy, unspecified, initial encounter: Secondary | ICD-10-CM | POA: Insufficient documentation

## 2023-01-04 MED ORDER — EPINEPHRINE 0.3 MG/0.3ML IJ SOAJ
0.3000 mg | Freq: Once | INTRAMUSCULAR | Status: AC
  Administered 2023-01-04: 0.3 mg via INTRAMUSCULAR
  Filled 2023-01-04: qty 0.3

## 2023-01-04 MED ORDER — METHYLPREDNISOLONE SODIUM SUCC 125 MG IJ SOLR
125.0000 mg | Freq: Once | INTRAMUSCULAR | Status: AC
  Administered 2023-01-04: 125 mg via INTRAVENOUS
  Filled 2023-01-04: qty 2

## 2023-01-04 MED ORDER — EPINEPHRINE 0.3 MG/0.3ML IJ SOAJ: 0.3000 mg | INTRAMUSCULAR | 1 refills | Status: AC | PRN

## 2023-01-04 MED ORDER — PREDNISONE 20 MG PO TABS: ORAL_TABLET | ORAL | 0 refills | Status: AC

## 2023-01-04 MED ORDER — FAMOTIDINE IN NACL 20-0.9 MG/50ML-% IV SOLN
20.0000 mg | Freq: Once | INTRAVENOUS | Status: AC
Start: 2023-01-04 — End: 2023-01-04
  Administered 2023-01-04: 20 mg via INTRAVENOUS
  Filled 2023-01-04: qty 50

## 2023-01-04 MED ORDER — DIPHENHYDRAMINE HCL 50 MG/ML IJ SOLN
50.0000 mg | Freq: Once | INTRAMUSCULAR | Status: AC
  Administered 2023-01-04: 50 mg via INTRAVENOUS
  Filled 2023-01-04: qty 1

## 2023-01-04 MED ORDER — SODIUM CHLORIDE 0.9 % IV BOLUS
1000.0000 mL | Freq: Once | INTRAVENOUS | Status: AC
  Administered 2023-01-04: 1000 mL via INTRAVENOUS

## 2023-01-04 NOTE — ED Provider Notes (Signed)
[place] EMERGENCY DEPARTMENT AT [facility] Provider Note   CSN: [id number] Arrival date & time: 01/04/23  1904     History {Add pertinent medical, surgical, social history, OB history to HPI:1} Chief Complaint  Patient presents with   Allergic Reaction    Samuel [name] is a 47 y.o. male.  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  He was stung on the foot today and started to have some shortness of breath.  He did not have any more EpiPen's with him   Allergic Reaction      Home Medications Prior to Admission medications   Medication Sig Start Date End Date Taking? Authorizing Provider  EPINEPHrine 0.3 mg/0.3 mL IJ SOAJ injection Inject 0.3 mg into the muscle as needed for anaphylaxis. 01/04/23  Yes Bethann Berkshire, MD  predniSONE (DELTASONE) 20 MG tablet 2 tabs po daily x 3 days 01/04/23  Yes Bethann Berkshire, MD  ibuprofen (ADVIL,MOTRIN) 200 MG tablet Take 400 mg by mouth every 6 (six) hours as needed.    [provider]      Allergies    Bee venom, Penicillins, and Phenergan [promethazine hcl]    Review of Systems   Review of Systems  Physical Exam Updated Vital Signs BP 108/86   Pulse 75   Temp 97.7 F (36.5 C) (Oral)   Resp 18   Wt 82.1 kg   SpO2 99%   BMI 23.88 kg/m  Physical Exam  ED Results / Procedures / Treatments   Labs (all labs ordered are listed, but only abnormal results are displayed) Labs Reviewed - No data to display  EKG None  Radiology No results found.  Procedures Procedures  {Document cardiac monitor, telemetry assessment procedure when appropriate:1}  Medications Ordered in ED Medications  EPINEPHrine (EPI-PEN) injection 0.3 mg (0.3 mg Intramuscular Given 01/04/23 1956)  methylPREDNISolone sodium succinate (SOLU-MEDROL) 125 mg/2 mL injection 125 mg (125 mg Intravenous Given 01/04/23 1952)  diphenhydrAMINE (BENADRYL) injection 50 mg (50 mg Intravenous Given 01/04/23 1952)  famotidine  (PEPCID) IVPB 20 mg premix (0 mg Intravenous Stopped 01/04/23 2040)  sodium chloride 0.9 % bolus 1,000 mL (1,000 mLs Intravenous New Bag/Given 01/04/23 1951)    ED Course/ Medical Decision Making/ A&P   {   Click here for ABCD2, HEART and other calculatorsREFRESH Note before signing :1}                              Medical Decision Making Risk Prescription drug management.   Patient with allergic reaction to bee sting.  Patient improved with treatment.  He is discharged home on prednisone and will take Benadryl and also was given an EpiPen  {Document critical care time when appropriate:1} {Document review of labs and clinical decision tools ie heart score, Chads2Vasc2 etc:1}  {Document your independent review of radiology images, and any outside records:1} {Document your discussion with family members, caretakers, and with consultants:1} {Document social determinants of health affecting pt's care:1} {Document your decision making why or why not admission, treatments were needed:1} Final Clinical Impression(s) / ED Diagnoses Final diagnoses:  Allergic reaction, initial encounter    Rx / DC Orders ED Discharge Orders          Ordered    predniSONE (DELTASONE) 20 MG tablet        01/04/23 2224    EPINEPHrine 0.3 mg/0.3 mL IJ SOAJ injection  As needed  01/04/23 2224            

## 2023-01-04 NOTE — Discharge Instructions (Signed)
Take Benadryl for any itching or swelling.  Return if any problems.  Follow-up with your doctor for recheck next week

## 2023-01-04 NOTE — ED Triage Notes (Signed)
C/o being stung by yellow jacket to right wrist PTA.  1 benadryl PTA.   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  No swelling or whelps noted in triage. No distress noted. Talking in full sentences.  Denies cp/sob. Pt reports itchiness to throat

## 2023-07-12 IMAGING — DX DG WRIST COMPLETE 3+V*R*
3 series · 3 of 3 positions shown · non-contrast
Comparison: Right hand x-rays dated August 22, 2015.

CLINICAL DATA: Right hand and wrist pain after falling off a roof.

EXAM:
RIGHT WRIST - COMPLETE 3+ VIEW; RIGHT HAND - COMPLETE 3+ VIEW

[wrist ap]
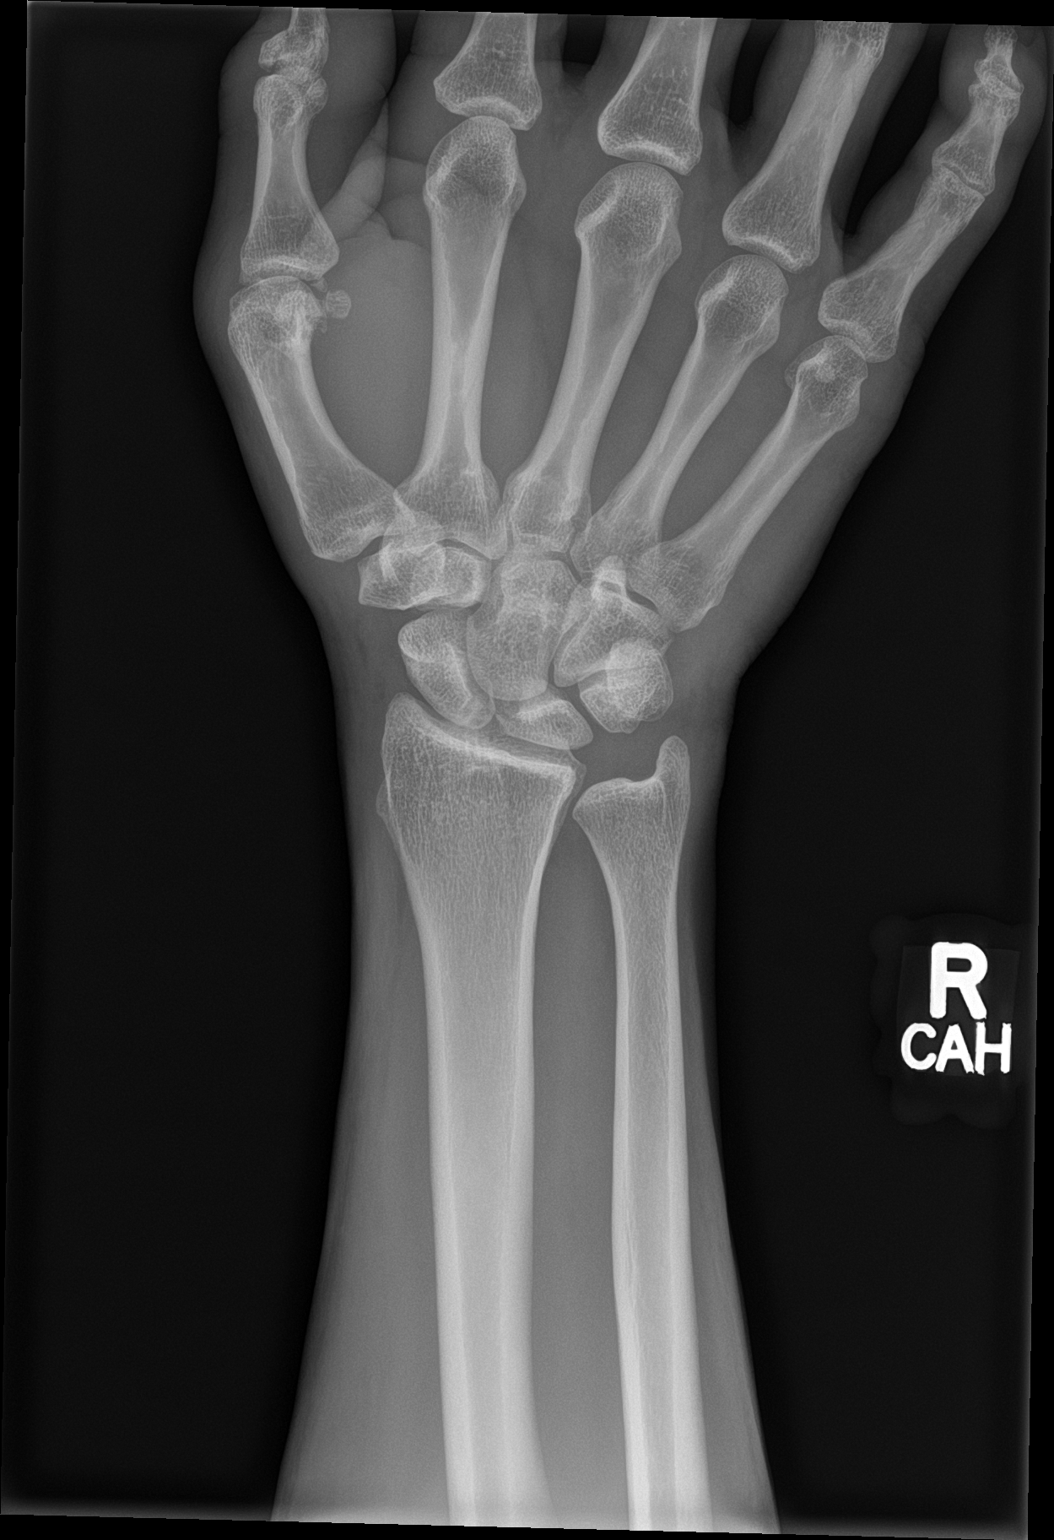

[wrist obl]
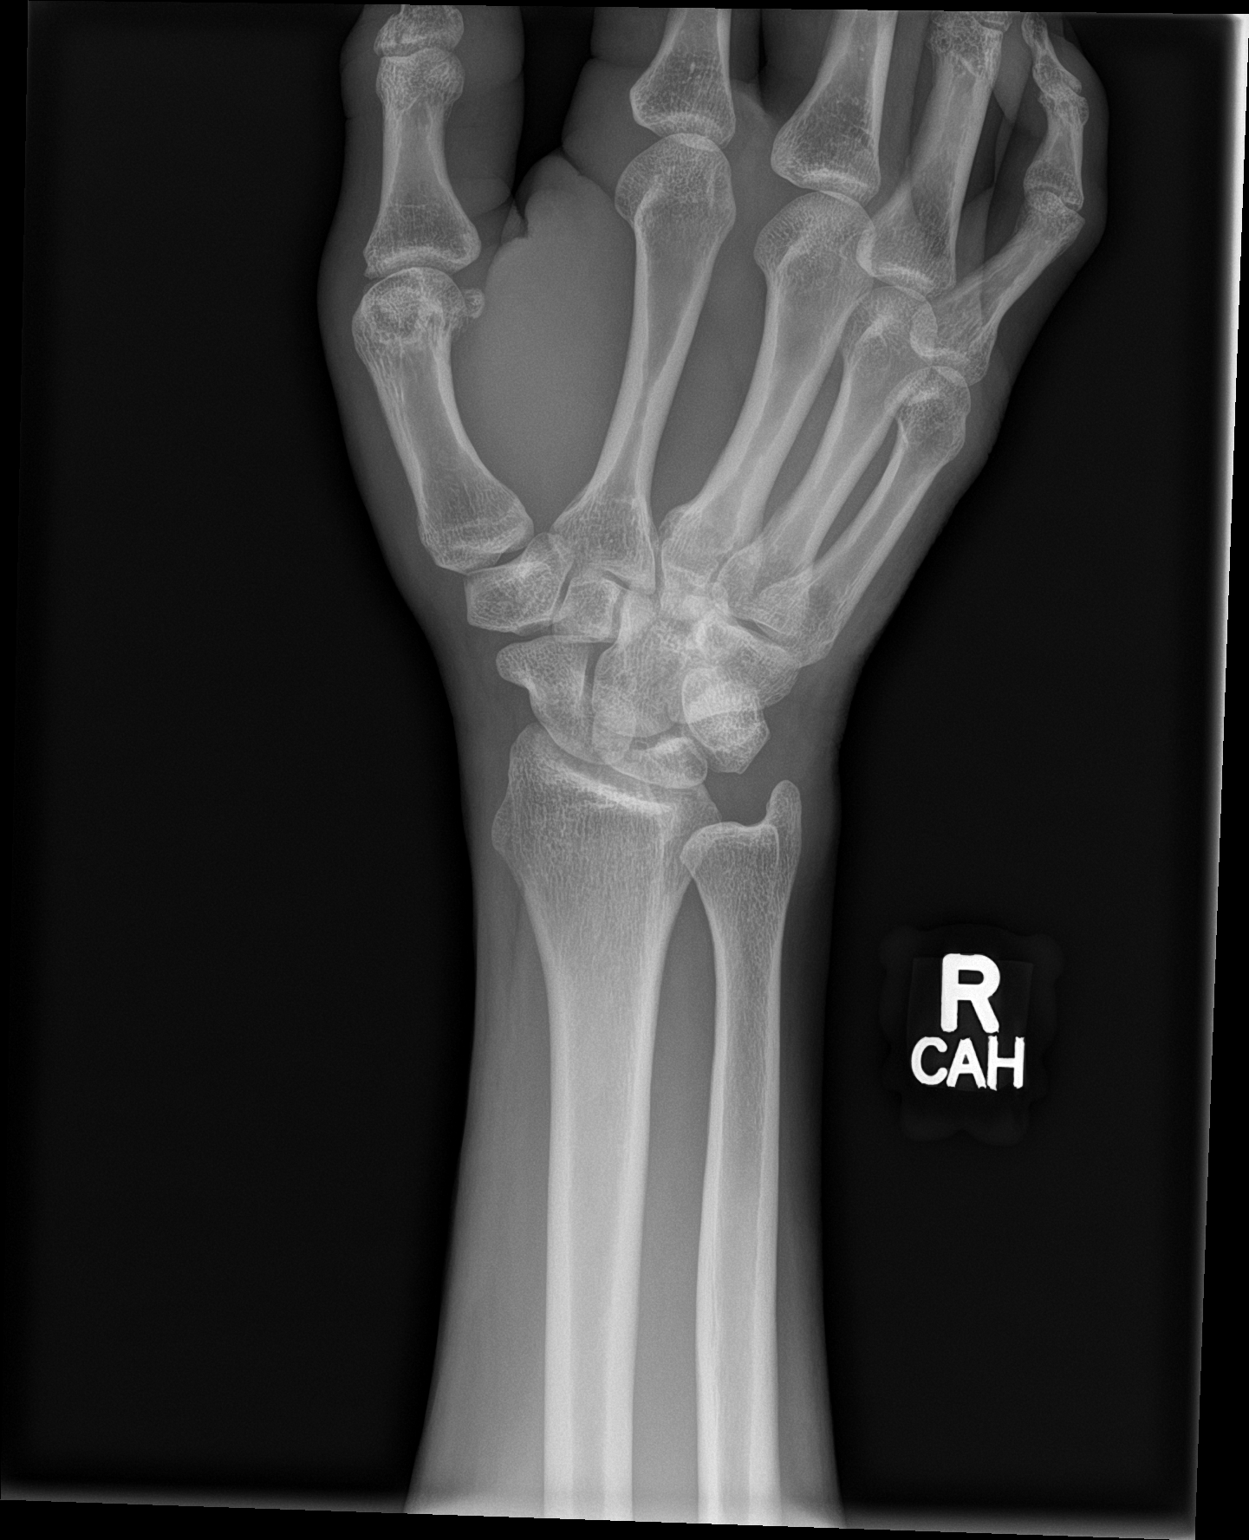

[wrist lat]
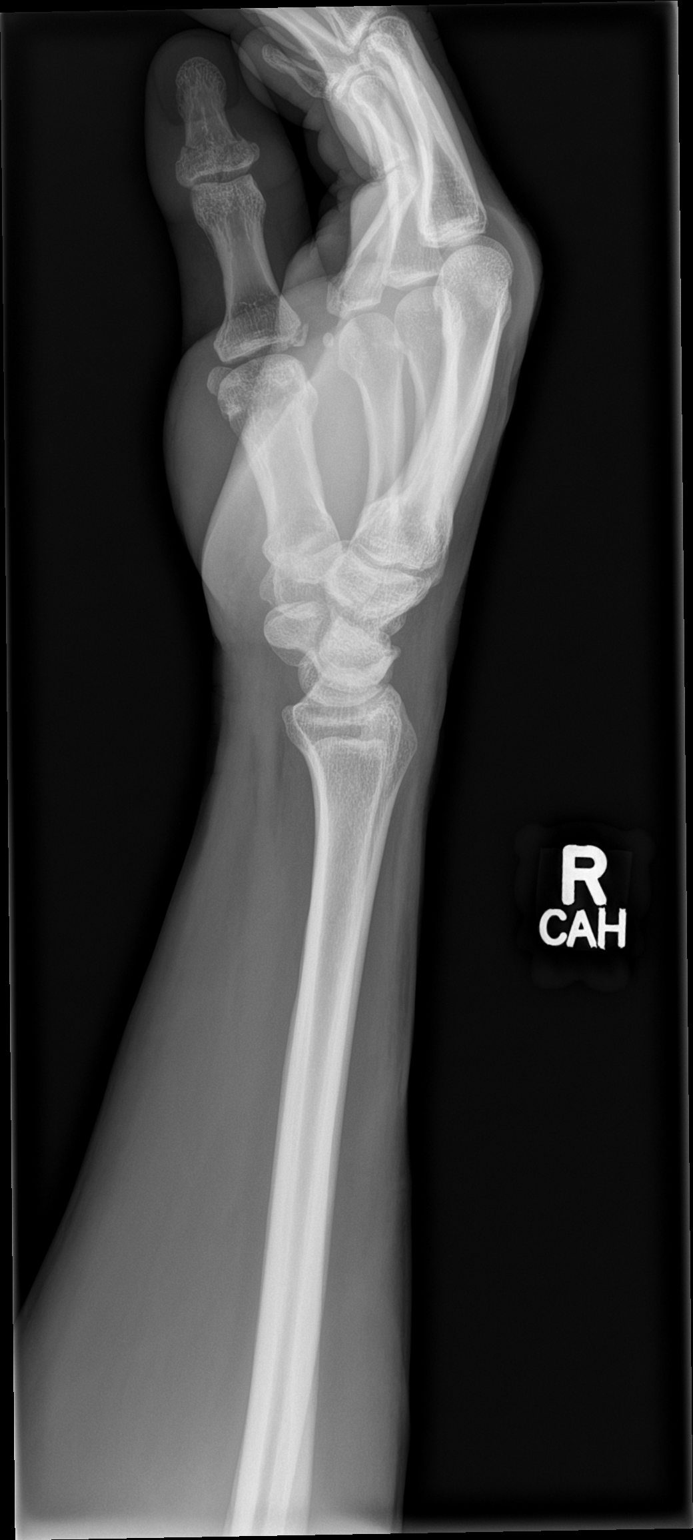

[3 of 3 positions shown; findings below may reference images not displayed]

FINDINGS: Acute comminuted intra-articular fracture at the base of the first
distal phalanx with mild dorsal angulation. Small acute avulsion
fracture at the ulnar base of the first proximal phalanx. No
additional fracture. No dislocation. Joint spaces are preserved.
Bone mineralization is normal. Soft tissue swelling of the thumb.
IMPRESSION: 1. Acute fractures of the first distal and proximal phalanges as
described above.
2. No wrist fracture.
